# Patient Record
Sex: Female | Born: 1975 | Race: Black or African American | Hispanic: No | Marital: Single | Smoking: Never smoker
Health system: Southern US, Community
[De-identification: ages and names within clinical notes are randomized; demographics above are authoritative.]

## PROBLEM LIST (undated history)

## (undated) DIAGNOSIS — Z9109 Other allergy status, other than to drugs and biological substances: Secondary | ICD-10-CM

## (undated) DIAGNOSIS — N946 Dysmenorrhea, unspecified: Secondary | ICD-10-CM

## (undated) DIAGNOSIS — D649 Anemia, unspecified: Secondary | ICD-10-CM

## (undated) DIAGNOSIS — D219 Benign neoplasm of connective and other soft tissue, unspecified: Secondary | ICD-10-CM

## (undated) DIAGNOSIS — D179 Benign lipomatous neoplasm, unspecified: Secondary | ICD-10-CM

## (undated) DIAGNOSIS — Z86718 Personal history of other venous thrombosis and embolism: Secondary | ICD-10-CM

## (undated) HISTORY — PX: MYOMECTOMY: SHX85

## (undated) HISTORY — PX: WISDOM TOOTH EXTRACTION: SHX21

---

## 1998-03-28 ENCOUNTER — Emergency Department (HOSPITAL_COMMUNITY): Admission: EM | Admit: 1998-03-28 | Discharge: 1998-03-28 | Payer: Self-pay | Admitting: Emergency Medicine

## 1998-12-03 ENCOUNTER — Emergency Department (HOSPITAL_COMMUNITY): Admission: EM | Admit: 1998-12-03 | Discharge: 1998-12-03 | Payer: Self-pay | Admitting: Emergency Medicine

## 1999-12-29 ENCOUNTER — Emergency Department (HOSPITAL_COMMUNITY): Admission: EM | Admit: 1999-12-29 | Discharge: 1999-12-29 | Payer: Self-pay | Admitting: Emergency Medicine

## 1999-12-29 ENCOUNTER — Encounter: Payer: Self-pay | Admitting: Emergency Medicine

## 2000-04-11 ENCOUNTER — Encounter: Payer: Self-pay | Admitting: Emergency Medicine

## 2000-04-11 ENCOUNTER — Emergency Department (HOSPITAL_COMMUNITY): Admission: EM | Admit: 2000-04-11 | Discharge: 2000-04-11 | Payer: Self-pay | Admitting: Internal Medicine

## 2006-11-30 ENCOUNTER — Emergency Department (HOSPITAL_COMMUNITY): Admission: EM | Admit: 2006-11-30 | Discharge: 2006-11-30 | Payer: Self-pay | Admitting: Emergency Medicine

## 2009-10-17 ENCOUNTER — Ambulatory Visit: Payer: Self-pay | Admitting: Family Medicine

## 2009-10-17 DIAGNOSIS — N644 Mastodynia: Secondary | ICD-10-CM

## 2009-10-17 DIAGNOSIS — R030 Elevated blood-pressure reading, without diagnosis of hypertension: Secondary | ICD-10-CM

## 2009-10-25 ENCOUNTER — Encounter: Payer: Self-pay | Admitting: Family Medicine

## 2010-03-06 NOTE — Assessment & Plan Note (Signed)
Summary: NOV: Breast pain, elevated BP   Vital Signs:  Patient profile:   35 year old female Height:      66 inches Weight:      239 pounds BMI:     38.72 Pulse rate:   75 / minute BP sitting:   148 / 91  (right arm) Cuff size:   large  Vitals Entered By: Avon Gully CMA, Duncan Dull) (October 17, 2009 10:27 AM) CC: NP-breast pain x at least two months, feel hot and burning, getting larger,neg preg test in june   CC:  NP-breast pain x at least two months, feel hot and burning, getting larger, and neg preg test in june.  History of Present Illness: NP-breast pain x at least two months, feel hot and burning, getting larger,neg preg test in june.  Wakes her up in the middle of the night sometimes. Sometimes it is bilat.  Had some nausea in June and thought initally pregnant but neg urine test.  Last period was short.  Then had GB US for the nausea but it was normal. Then thought it was hormonal. Worse in the nipple area.  Never had this before. No family hx of breast cancer. Han't felt any lumps. Having changed bra sizes. Periods are regular. No weight gain.  Just started exercises. No supplements. No rashes. No d/c fromt the nipples.  Came off OCPs before June.     Habits & Providers  Alcohol-Tobacco-Diet     Alcohol drinks/day: 0     Tobacco Status: never  Exercise-Depression-Behavior     Does Patient Exercise: no     STD Risk: never     Drug Use: no     Seat Belt Use: always  Current Medications (verified): 1)  Multivitamins  Tabs (Multiple Vitamin) .... One Tablet By Mouth Once A Day  Allergies (verified): No Known Drug Allergies  Comments:  Nurse/Medical Assistant: The patient's medications and allergies were reviewed with the patient and were updated in the Medication and Allergy Lists. 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  Never Smoked Alcohol use-no Drug use-no Regular exercise-no Smoking Status:  never Does Patient Exercise:  no STD Risk:  never Drug Use:  no Seat Belt Use:  always  Review of Systems       No fever/sweats/weakness, unexplained weight loss/gain.  No vison changes.  No difficulty hearing/ringing in ears, hay fever/allergies.  No chest pain/discomfort, palpitations.  No Br lump/nipple discharge.  No cough/wheeze.  No blood in BM, nausea/vomiting/diarrhea.  No nighttime urination, leaking urine, unusual vaginal bleeding, discharge (penis or vagina).  No muscle/joint pain. No rash, change in mole.  No HA, memory loss.  No anxiety, sleep d/o, depression.  No easy bruising/bleeding, unexplained lump   Physical Exam  General:  Well-developed,well-nourished,in no acute distress; alert,appropriate and cooperative throughout examination Chest Wall:  No deformities, masses, or tenderness noted. Breasts:  No mass, nodules, thickening, tenderness, bulging, retraction, inflamation, nipple discharge or skin changes noted.   Lungs:  Normal respiratory effort, chest expands symmetrically. Lungs are clear to auscultation, no crackles or wheezes. Heart:  Normal rate and regular rhythm. S1 and S2 normal without gallop, murmur, click, rub or other extra sounds.   Impression & Recommendations:  Problem # 1:  BREAST PAIN, BILATERAL (ICD-611.71) Gave  reassurance that exam is normla.  REc comfortable bra. Her pain has gradually gotten some better but his is unusual. Will refer for diagnostic mammogram. Likely benign.  Can use OTC pain relievers if would like.   Orders: T-Mammography, Diagnostic (bilateral) (08657)  Problem # 2:  ELEVATED BLOOD PRESSURE WITHOUT DIAGNOSIS OF HYPERTENSION (ICD-796.2) She says her BP is usually normal.  Recheck BP in 2 weeks. AVoidsalt and caffeine.   Complete Medication List: 1)   Multivitamins Tabs (Multiple vitamin) .... One tablet by mouth once a day  Patient Instructions: 1)  REcheck BP with nurse visit in 2 weeks. Avoid salt and caffeine.  2)  We will call you wiht the mammogram referral.   Contraindications/Deferment of Procedures/Staging:    Test/Procedure: FLU VAX    Reason for deferment: patient declined  Appended Document: NOV: Breast pain, elevated BP   Appended Document: NOV: Breast pain, elevated BP Call pt: Normal mammogram. Repeat at age 89.  September 23, 20118:06 AM [place] MD, Santina Evans   10/27/2009 @ 8:43am- Pt notified of results. KJ LPN

## 2012-03-04 ENCOUNTER — Emergency Department (INDEPENDENT_AMBULATORY_CARE_PROVIDER_SITE_OTHER)
Admission: EM | Admit: 2012-03-04 | Discharge: 2012-03-04 | Disposition: A | Discharge status: Home / Self Care | Payer: Managed Care, Other (non HMO) | Source: Home / Self Care | Attending: Family Medicine | Admitting: Family Medicine

## 2012-03-04 ENCOUNTER — Emergency Department (INDEPENDENT_AMBULATORY_CARE_PROVIDER_SITE_OTHER): Payer: Managed Care, Other (non HMO)

## 2012-03-04 ENCOUNTER — Encounter: Payer: Self-pay | Admitting: *Deleted

## 2012-03-04 DIAGNOSIS — J111 Influenza due to unidentified influenza virus with other respiratory manifestations: Secondary | ICD-10-CM

## 2012-03-04 DIAGNOSIS — R509 Fever, unspecified: Secondary | ICD-10-CM

## 2012-03-04 DIAGNOSIS — R0602 Shortness of breath: Secondary | ICD-10-CM

## 2012-03-04 DIAGNOSIS — R05 Cough: Secondary | ICD-10-CM

## 2012-03-04 DIAGNOSIS — J101 Influenza due to other identified influenza virus with other respiratory manifestations: Secondary | ICD-10-CM

## 2012-03-04 DIAGNOSIS — J40 Bronchitis, not specified as acute or chronic: Secondary | ICD-10-CM

## 2012-03-04 LAB — POCT INFLUENZA A/B
Influenza A, POC: POSITIVE
Influenza B, POC: NEGATIVE

## 2012-03-04 MED ORDER — OSELTAMIVIR PHOSPHATE 75 MG PO CAPS: 75.0000 mg | ORAL_CAPSULE | Freq: Two times a day (BID) | ORAL | Status: DC

## 2012-03-04 MED ORDER — AZITHROMYCIN 250 MG PO TABS: ORAL_TABLET | ORAL | Status: DC

## 2012-03-04 MED ORDER — METHYLPREDNISOLONE ACETATE 80 MG/ML IJ SUSP
80.0000 mg | Freq: Once | INTRAMUSCULAR | Status: AC
  Administered 2012-03-04: 80 mg via INTRAMUSCULAR

## 2012-03-04 NOTE — ED Provider Notes (Signed)
History     CSN: [id number]  Arrival date & time 03/04/12  1211   First MD Initiated Contact with Patient 03/04/12 1229      Chief Complaint  Patient presents with  . Fever  . Cough  . Shortness of Breath   HPI  URI Symptoms Onset:  2 days  Description: cough, generalized malaise, fever, chills, cough, SOB  Modifying factors:  No flu shot this season   Symptoms Nasal discharge: yes Fever: yes Sore throat: no Cough: yes Wheezing: yes Ear pain: no GI symptoms: no Sick contacts: yes  Red Flags  Stiff neck: no Dyspnea: yes Rash: no Swallowing difficulty: no  Sinusitis Risk Factors Headache/face pain: no Double sickening: no tooth pain: no  Allergy Risk Factors Sneezing: no Itchy scratchy throat: no Seasonal symptoms: no  Flu Risk Factors Headache: yes muscle aches: yes  severe fatigue: yes    History reviewed. No pertinent past medical history.  History reviewed. No pertinent past surgical history.  Family History  Problem Relation Age of Onset  . Hypertension Mother     History  Substance Use Topics  . Smoking status: Never Smoker   . Smokeless tobacco: Not on file  . Alcohol Use: No    OB History    Grav Para Term Preterm Abortions TAB SAB Ect Mult Living                  Review of Systems  All other systems reviewed and are negative.    Allergies  Review of patient's allergies indicates no known allergies.  Home Medications  No current outpatient prescriptions on file.  BP 126/85  Pulse 116  Temp 99.8 F (37.7 C) (Oral)  Resp 18  Wt 243 lb (110.224 kg)  SpO2 95%  Physical Exam  Constitutional:       Mildly ill appearing, NAD   HENT:  Head: Normocephalic and atraumatic.  Right Ear: External ear normal.  Left Ear: External ear normal.       +nasal erythema, rhinorrhea bilaterally, + post oropharyngeal erythema    Eyes: Conjunctivae normal are normal. Pupils are equal, round, and reactive to light.  Neck: Normal  range of motion. Neck supple.  Cardiovascular: Normal rate, regular rhythm and normal heart sounds.   Pulmonary/Chest: Effort normal.       Faint wheezes in bases    Abdominal: Soft.  Musculoskeletal: Normal range of motion.  Lymphadenopathy:    She has no cervical adenopathy.  Neurological: She is alert.  Skin: Skin is warm.    ED Course  Procedures (including critical care time)  Labs Reviewed - No data to display Dg Chest 2 View  03/04/2012  *RADIOLOGY REPORT*  Clinical Data: Fever, cough, shortness of breath.  CHEST - 2 VIEW  Comparison: None.  Findings: Mild peribronchial thickening. Heart and mediastinal contours are within normal limits.  No focal opacities or effusions.  No acute bony abnormality.  IMPRESSION: Mild bronchitic changes.   Original Report Authenticated By: Charlett Nose, M.D.      1. Influenza A   2. Bronchitis       MDM  Will treat with tamiflu and zpak.  Depomedrol 80mg  IM x1 for wheezing. Continue albuterol inhaler at home prn.  Discussed infectious and resp red flags at length. Follow up with PCP early next week.  Otherwise follow up as needed.    The patient and/or caregiver has been counseled thoroughly with regard to treatment plan and/or medications prescribed including dosage, schedule,  interactions, rationale for use, and possible side effects and they verbalize understanding. Diagnoses and expected course of recovery discussed and will return if not improved as expected or if the condition worsens. Patient and/or caregiver verbalized understanding.              Doree Albee, MD 03/04/12 1329

## 2012-03-04 NOTE — ED Notes (Signed)
Pt c/o fever, congestion, cough and SOB x 1 day. Pt reports that She did not receive a flu vaccine.

## 2014-02-17 IMAGING — CR DG CHEST 2V
2 series · 2 of 2 positions shown · non-contrast
Comparison: None.

CLINICAL DATA: Fever, cough, shortness of breath.

CHEST - 2 VIEW

[view not recorded (1 of 2)]
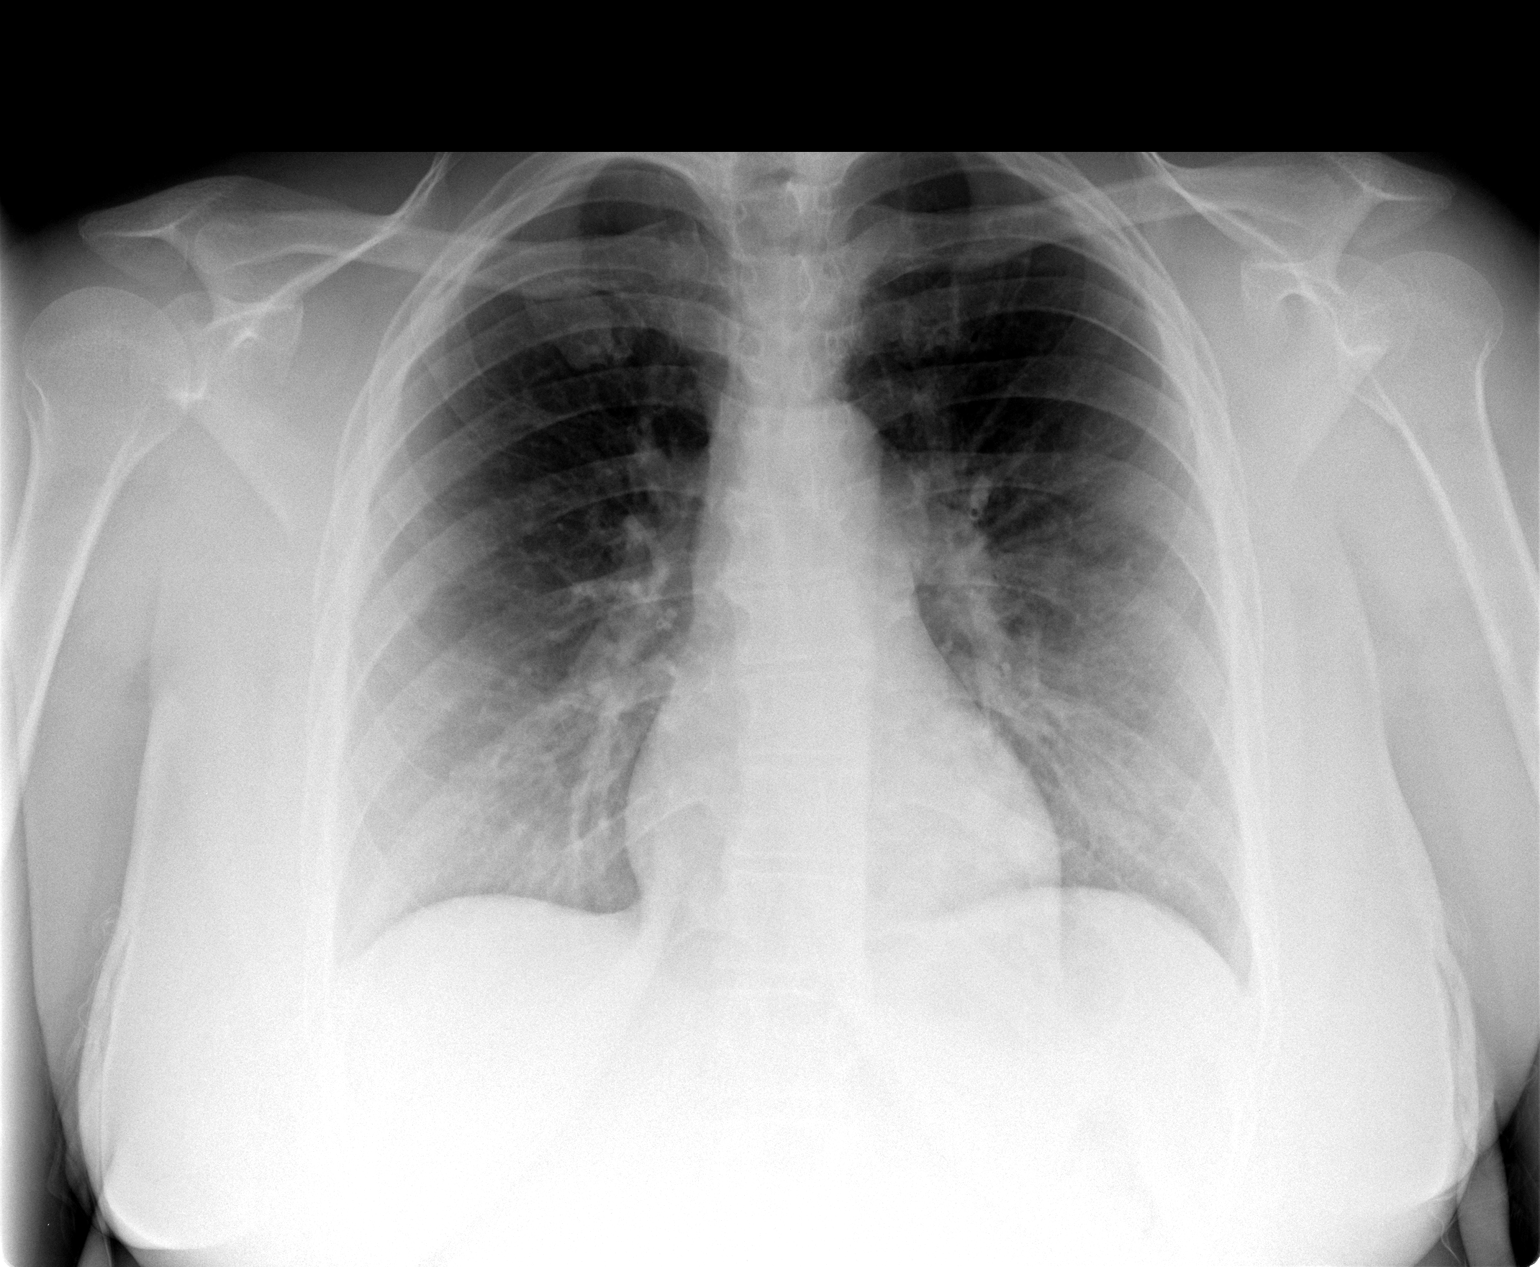

[view not recorded (2 of 2)]
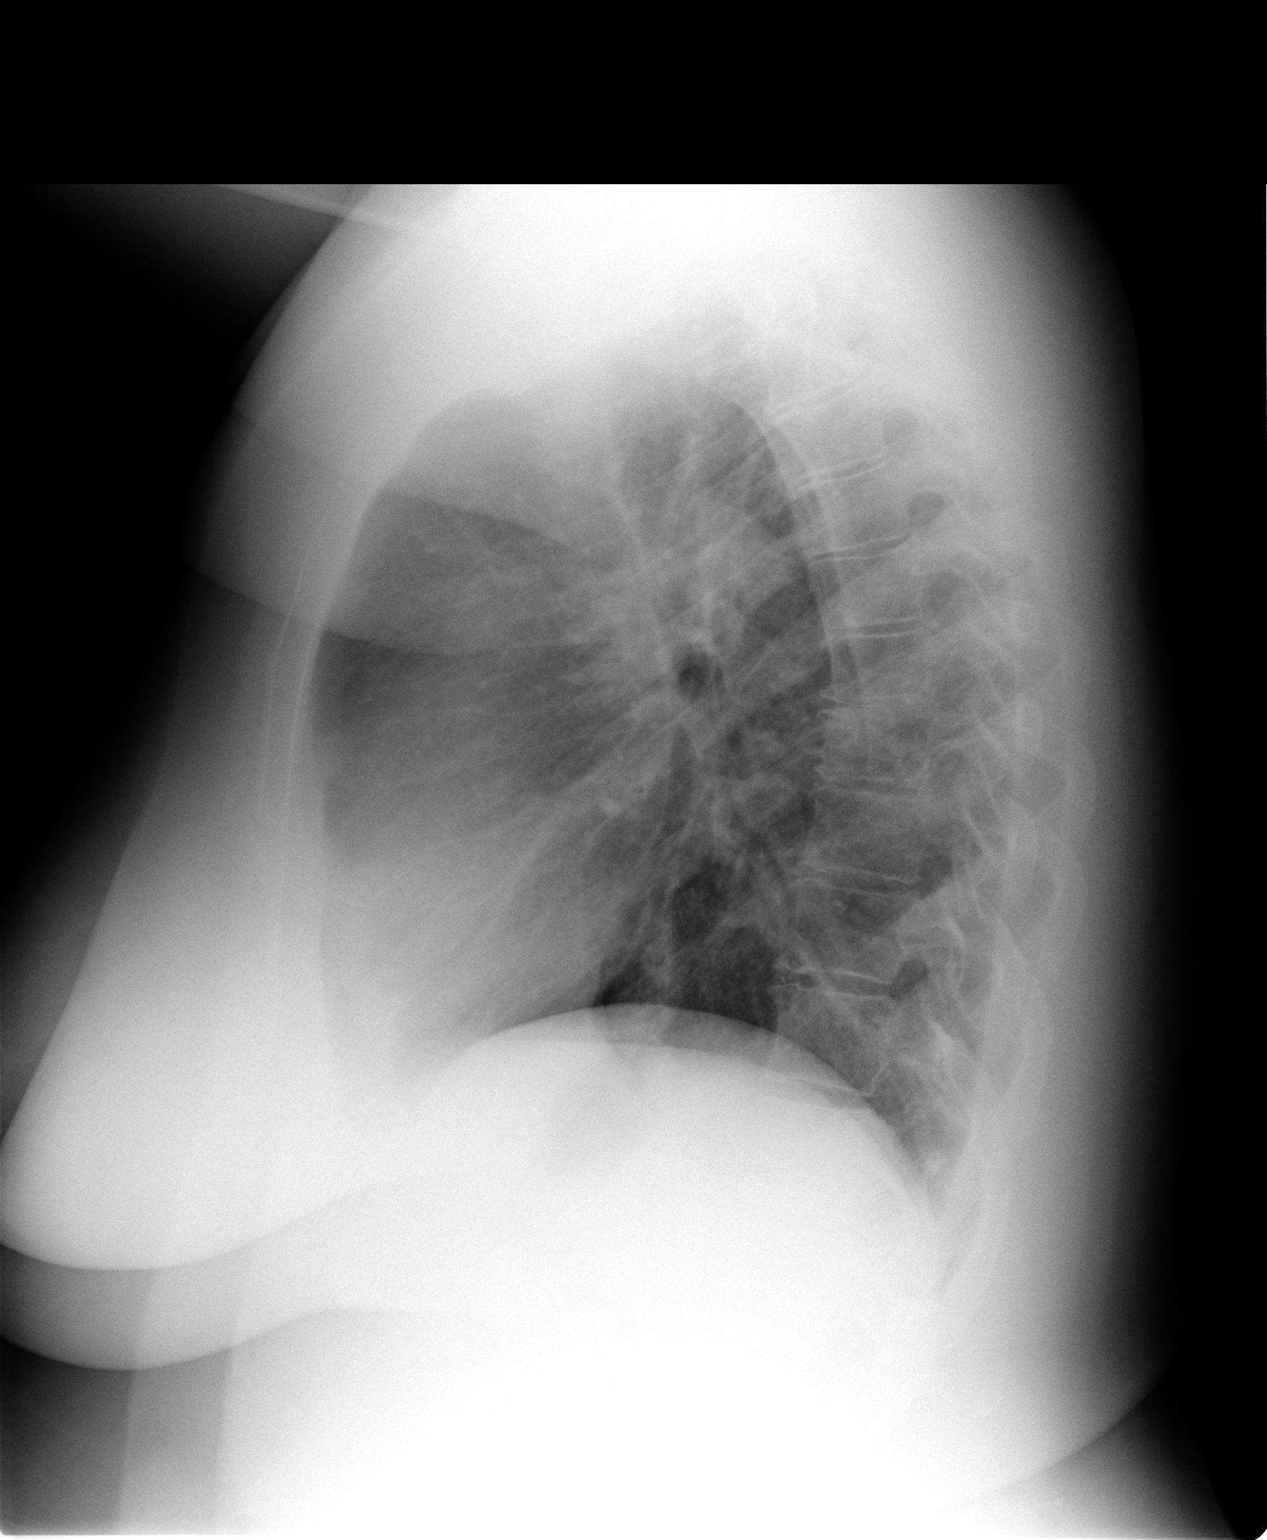

[2 of 2 positions shown; findings below may reference images not displayed]

FINDINGS: Mild peribronchial thickening. Heart and mediastinal
contours are within normal limits.  No focal opacities or
effusions.  No acute bony abnormality.
IMPRESSION: Mild bronchitic changes.

## 2014-05-20 ENCOUNTER — Encounter: Payer: Self-pay | Admitting: Emergency Medicine

## 2014-05-20 ENCOUNTER — Emergency Department (INDEPENDENT_AMBULATORY_CARE_PROVIDER_SITE_OTHER): Payer: Managed Care, Other (non HMO)

## 2014-05-20 ENCOUNTER — Emergency Department
Admission: EM | Admit: 2014-05-20 | Discharge: 2014-05-20 | Disposition: A | Discharge status: Home / Self Care | Payer: Managed Care, Other (non HMO) | Source: Home / Self Care | Attending: Family Medicine | Admitting: Family Medicine

## 2014-05-20 DIAGNOSIS — R0602 Shortness of breath: Secondary | ICD-10-CM

## 2014-05-20 DIAGNOSIS — B9789 Other viral agents as the cause of diseases classified elsewhere: Principal | ICD-10-CM

## 2014-05-20 DIAGNOSIS — J9801 Acute bronchospasm: Secondary | ICD-10-CM | POA: Diagnosis not present

## 2014-05-20 DIAGNOSIS — H6592 Unspecified nonsuppurative otitis media, left ear: Secondary | ICD-10-CM | POA: Diagnosis not present

## 2014-05-20 DIAGNOSIS — J069 Acute upper respiratory infection, unspecified: Secondary | ICD-10-CM | POA: Diagnosis not present

## 2014-05-20 DIAGNOSIS — R05 Cough: Secondary | ICD-10-CM | POA: Diagnosis not present

## 2014-05-20 MED ORDER — ALBUTEROL SULFATE HFA 108 (90 BASE) MCG/ACT IN AERS: 2.0000 | INHALATION_SPRAY | RESPIRATORY_TRACT | Status: DC | PRN

## 2014-05-20 MED ORDER — PREDNISONE 20 MG PO TABS: 20.0000 mg | ORAL_TABLET | Freq: Two times a day (BID) | ORAL | Status: DC

## 2014-05-20 MED ORDER — BENZONATATE 200 MG PO CAPS: 200.0000 mg | ORAL_CAPSULE | Freq: Every day | ORAL | Status: DC

## 2014-05-20 MED ORDER — AMOXICILLIN 875 MG PO TABS: 875.0000 mg | ORAL_TABLET | Freq: Two times a day (BID) | ORAL | Status: DC

## 2014-05-20 NOTE — ED Provider Notes (Signed)
CSN: [id number]     Arrival date & time 05/20/14  1239 History   First MD Initiated Contact with Patient 05/20/14 1325     Chief Complaint  Patient presents with  . Nasal Congestion  . Cough  . Shortness of Breath      HPI Comments: Patient developed sinus congestion with clear discharge three days ago.  The next day she developed myalgias, fatigue, fever to 100.2/sweats, and a productive cough.  She has now developed wheezing and shortness of breath with activity.  She has a history of exercise asthma and has used an albuterol inhaler in the past.  The history is provided by the patient.    History reviewed. No pertinent past medical history. History reviewed. No pertinent past surgical history. Family History  Problem Relation Age of Onset  . Hypertension Mother   . Diabetes Maternal Uncle    History  Substance Use Topics  . Smoking status: Never Smoker   . Smokeless tobacco: Not on file  . Alcohol Use: No   OB History    No data available     Review of Systems No sore throat + cough No pleuritic pain + wheezing + nasal congestion + post-nasal drainage No sinus pain/pressure No itchy/red eyes No earache No hemoptysis + SOB with activity + fever, + chills No nausea No vomiting No abdominal pain No diarrhea No urinary symptoms No skin rash + fatigue + myalgias + headache Used OTC meds without relief  Allergies  Review of patient's allergies indicates no known allergies.  Home Medications   Prior to Admission medications   Medication Sig Start Date End Date Taking? Authorizing Provider  albuterol (PROVENTIL HFA;VENTOLIN HFA) 108 (90 BASE) MCG/ACT inhaler Inhale 2 puffs into the lungs every 4 (four) hours as needed for wheezing or shortness of breath. 05/20/14   Kandra Nicolas, MD  amoxicillin (AMOXIL) 875 MG tablet Take 1 tablet (875 mg total) by mouth 2 (two) times daily. 05/20/14   Kandra Nicolas, MD  benzonatate (TESSALON) 200 MG capsule Take 1 capsule  (200 mg total) by mouth at bedtime. Take as needed for cough 05/20/14   Kandra Nicolas, MD  predniSONE (DELTASONE) 20 MG tablet Take 1 tablet (20 mg total) by mouth 2 (two) times daily. Take with food. 05/20/14   Kandra Nicolas, MD   BP 166/80 mmHg  Pulse 93  Temp(Src) 98.5 F (36.9 C) (Oral)  Resp 18  Ht 5\' 6"  (1.676 m)  Wt 240 lb (108.863 kg)  BMI 38.76 kg/m2  SpO2 95%  LMP 04/22/2014 (Exact Date) Physical Exam Nursing notes and Vital Signs reviewed. Appearance:  Patient appears stated age, and in no acute distress.  Patient is obese (BMI 38.8) Eyes:  Pupils are equal, round, and reactive to light and accomodation.  Extraocular movement is intact.  Conjunctivae are not inflamed  Ears:  Canals normal.  Right tympanic membrane normal.  Left tympanic membrane has serous effusion. Nose:  Congested turbinates.  No sinus tenderness.  Pharynx:  Normal Neck:  Supple.  Slightly tender posterior nodes are palpated bilaterally  Lungs:   Bilateral faint expiratory wheezes prsent.  Breath sounds are equal.  Heart:  Regular rate and rhythm without murmurs, rubs, or gallops.  Abdomen:  Nontender without masses or hepatosplenomegaly.  Bowel sounds are present.  No CVA or flank tenderness.  Extremities:  No edema.  No calf tenderness Skin:  No rash present.   ED Course  Procedures none   Labs  Reviewed -   Tympanogram:  Left ear negative peak pressure; right ear normal   Imaging Review Dg Chest 2 View  05/20/2014   CLINICAL DATA:  Sob, cough, congestion x3days. Nonsmoker.  EXAM: CHEST  2 VIEW  COMPARISON:  03/04/2012  FINDINGS: Cardiac silhouette normal in size and configuration. No mediastinal or hilar masses or evidence of adenopathy. Clear lungs. No pleural effusion or pneumothorax.  Bony thorax is intact.  IMPRESSION: No active cardiopulmonary disease.   Electronically Signed   By: Lajean Manes M.D.   On: 05/20/2014 13:36     MDM   1. Viral URI with cough   2. Bronchospasm, acute   3.  Left serous otitis media, recurrence not specified, unspecified chronicity    Begin prednisone burst, amoxicillin 875mg  BID.  Prescription written for Benzonatate [facility]) to take at bedtime for night-time cough.  Begin albuterol inhaler prn. Take plain guaifenesin (1200mg  extended release tabs such as Mucinex) twice daily, with plenty of water, for cough and congestion.  May add Pseudoephedrine (30mg , one or two every 4 to 6 hours) for sinus congestion.  Get adequate rest.   May use Afrin nasal spray (or generic oxymetazoline) twice daily for about 5 days.  Also recommend using saline nasal spray several times daily and saline nasal irrigation (AYR is a common brand).   Try warm salt water gargles for sore throat.  Stop all antihistamines for now, and other non-prescription cough/cold preparations. May take Ibuprofen 200mg , 4 tabs every 8 hours with food for headache, etc.   Follow-up with family doctor if not improving about10 days.     Kandra Nicolas, MD 05/23/14 1159

## 2014-05-20 NOTE — Discharge Instructions (Signed)
Take plain guaifenesin (1200mg  extended release tabs such as Mucinex) twice daily, with plenty of water, for cough and congestion.  May add Pseudoephedrine (30mg , one or two every 4 to 6 hours) for sinus congestion.  Get adequate rest.   May use Afrin nasal spray (or generic oxymetazoline) twice daily for about 5 days.  Also recommend using saline nasal spray several times daily and saline nasal irrigation (AYR is a common brand).   Try warm salt water gargles for sore throat.  Stop all antihistamines for now, and other non-prescription cough/cold preparations. May take Ibuprofen 200mg , 4 tabs every 8 hours with food for headache, etc.   Follow-up with family doctor if not improving about10 days.

## 2014-05-20 NOTE — ED Notes (Signed)
Reports 3 day history of congestion, cough, fever, some shortness of breath and body aches. She has been taking tylenol, sudafed, and mucinex.

## 2014-08-14 ENCOUNTER — Encounter: Payer: Self-pay | Admitting: Emergency Medicine

## 2014-08-14 ENCOUNTER — Emergency Department
Admission: EM | Admit: 2014-08-14 | Discharge: 2014-08-14 | Disposition: A | Discharge status: Home / Self Care | Payer: Managed Care, Other (non HMO) | Source: Home / Self Care | Attending: Family Medicine | Admitting: Family Medicine

## 2014-08-14 DIAGNOSIS — R350 Frequency of micturition: Secondary | ICD-10-CM | POA: Diagnosis not present

## 2014-08-14 DIAGNOSIS — M545 Low back pain, unspecified: Secondary | ICD-10-CM

## 2014-08-14 LAB — POCT URINALYSIS DIP (MANUAL ENTRY)
Bilirubin, UA: NEGATIVE
Glucose, UA: NEGATIVE
Ketones, POC UA: NEGATIVE
Leukocytes, UA: NEGATIVE
Nitrite, UA: NEGATIVE
Protein Ur, POC: NEGATIVE
Spec Grav, UA: 1.015 (ref 1.005–1.03)
Urobilinogen, UA: 1 (ref 0–1)
pH, UA: 7 (ref 5–8)

## 2014-08-14 MED ORDER — PHENAZOPYRIDINE HCL 200 MG PO TABS: 200.0000 mg | ORAL_TABLET | Freq: Three times a day (TID) | ORAL | Status: DC

## 2014-08-14 MED ORDER — NITROFURANTOIN MONOHYD MACRO 100 MG PO CAPS: 100.0000 mg | ORAL_CAPSULE | Freq: Two times a day (BID) | ORAL | Status: DC

## 2014-08-14 NOTE — Discharge Instructions (Signed)
Today in urine only showed a small trace of blood flow concern for bacterial urinary infection.  However, your urine has been sent for urine culture.  You will be called within the next 2-3 days with the results to advise whether you should complete her antibiotic or not.  Regardless taking antibiotic, you should follow-up with primary care and/or urology within 1 week if symptoms not improving.  See below for further instructions.

## 2014-08-14 NOTE — ED Provider Notes (Signed)
CSN: [id number]     Arrival date & time 08/14/14  1236 History   First MD Initiated Contact with Patient 08/14/14 1254     Chief Complaint  Patient presents with  . Urinary Tract Infection   (Consider location/radiation/quality/duration/timing/severity/associated sxs/prior Treatment) HPI  Patient is a 39 year old female presenting to urgent care with complaint of 2 day history urinary frequency and left lower back pain that seems to be worsening.  Pain is achy and sore.  Mild to moderate in severity.  Patient also reports some discomfort with urination.  She has been taking Azo without relief.  Denies fevers, chills, nausea, vomiting or diarrhea.  Patient does report history of recurrent UTIs.  States this feels similar to prior UTIs.  States she has seen a urologist who cannot figure out why she keeps getting UTIs.  Denies known triggers.  Denies history of kidney stones.  Denies abdominal pain at this time.  Denies vaginal symptoms.  History reviewed. No pertinent past medical history. History reviewed. No pertinent past surgical history. Family History  Problem Relation Age of Onset  . Hypertension Mother   . Diabetes Maternal Uncle    History  Substance Use Topics  . Smoking status: Never Smoker   . Smokeless tobacco: Not on file  . Alcohol Use: No   OB History    No data available     Review of Systems  Constitutional: Negative for fever and chills.  Respiratory: Negative for cough and shortness of breath.   Cardiovascular: Negative for chest pain and palpitations.  Gastrointestinal: Negative for nausea, vomiting, abdominal pain and diarrhea.  Genitourinary: Positive for frequency. Negative for dysuria, urgency, hematuria, flank pain and pelvic pain.  Musculoskeletal: Positive for back pain ( Left lower). Negative for myalgias.  All other systems reviewed and are negative.   Allergies  Review of patient's allergies indicates no known allergies.  Home Medications    Prior to Admission medications   Medication Sig Start Date End Date Taking? Authorizing Provider  albuterol (PROVENTIL HFA;VENTOLIN HFA) 108 (90 BASE) MCG/ACT inhaler Inhale 2 puffs into the lungs every 4 (four) hours as needed for wheezing or shortness of breath. 05/20/14   Kandra Nicolas, MD  amoxicillin (AMOXIL) 875 MG tablet Take 1 tablet (875 mg total) by mouth 2 (two) times daily. 05/20/14   Kandra Nicolas, MD  benzonatate (TESSALON) 200 MG capsule Take 1 capsule (200 mg total) by mouth at bedtime. Take as needed for cough 05/20/14   Kandra Nicolas, MD  nitrofurantoin, macrocrystal-monohydrate, (MACROBID) 100 MG capsule Take 1 capsule (100 mg total) by mouth 2 (two) times daily. For 5 days 08/14/14   Noland Fordyce, PA-C  phenazopyridine (PYRIDIUM) 200 MG tablet Take 1 tablet (200 mg total) by mouth 3 (three) times daily. 08/14/14   Noland Fordyce, PA-C  predniSONE (DELTASONE) 20 MG tablet Take 1 tablet (20 mg total) by mouth 2 (two) times daily. Take with food. 05/20/14   Kandra Nicolas, MD   BP 144/89 mmHg  Pulse 85  Temp(Src) 99 F (37.2 C) (Oral)  Resp 16  Ht 5\' 6"  (1.676 m)  Wt 245 lb (111.131 kg)  BMI 39.56 kg/m2  SpO2 99%  LMP 08/01/2014 Physical Exam  Constitutional: She appears well-developed and well-nourished. No distress.  HENT:  Head: Normocephalic and atraumatic.  Eyes: Conjunctivae are normal. No scleral icterus.  Neck: Normal range of motion. Neck supple.  Cardiovascular: Normal rate, regular rhythm and normal heart sounds.   Pulmonary/Chest: Effort normal  and breath sounds normal. No respiratory distress. She has no wheezes. She has no rales. She exhibits no tenderness.  Abdominal: Soft. Bowel sounds are normal. She exhibits no distension and no mass. There is no tenderness. There is no rebound and no guarding.  Soft, non-distended, non-tender. No CVAT  Musculoskeletal: Normal range of motion. She exhibits tenderness.  Mild tenderness to Left lumbar muscles. No  midline spinal tenderness. FROM upper and lower extremities with 5/5 strength bilaterally.   Neurological: She is alert.  Skin: Skin is warm and dry. She is not diaphoretic.  Nursing note and vitals reviewed.   ED Course  Procedures (including critical care time) Labs Review Labs Reviewed  POCT URINALYSIS DIP (MANUAL ENTRY) - Abnormal; Notable for the following:    Blood, UA small (*)    All other components within normal limits  URINE CULTURE    Imaging Review No results found.   MDM   1. Urinary frequency   2. Left-sided low back pain without sciatica     Patient is a 39 year old female with history of recurrent UTIs presenting to urgent care with urinary frequency and left lower back pain.  Patient appears well, nontoxic temp of 99 on exam.  No CVA tenderness, but there is left lower lumbar muscle tenderness with palpation.  No bony tenderness.  No focal neuro deficits.  Patient denies vaginal symptoms.  Abdomen is soft, nontender.  Doubt surgical abdomen.  UA is significant for small amount of blood, otherwise no evidence of UTI.  Will send urine for culture.  Due to symptoms being consistent with prior UTIs as well as blood in urine flow prescribed.  Patient Macrobid, however.  Advised not to fill for 2-3 days until culture results, as she will be notified of results.  Patient was prescribed Pyridium which she may fill immediately to help with symptoms. Return precautions provided. Pt verbalized understanding and agreement with tx plan.     Noland Fordyce, PA-C 08/14/14 1346

## 2014-08-14 NOTE — ED Notes (Signed)
Patient presents to the [facility] with C/O urinary frequency and lower back pain since Friday. Taking AZO without relief.

## 2014-08-15 ENCOUNTER — Telehealth: Payer: Self-pay | Admitting: *Deleted

## 2014-08-15 LAB — URINE CULTURE: Colony Count: 35000

## 2014-12-05 ENCOUNTER — Encounter: Payer: Self-pay | Admitting: Osteopathic Medicine

## 2014-12-05 ENCOUNTER — Ambulatory Visit (INDEPENDENT_AMBULATORY_CARE_PROVIDER_SITE_OTHER): Payer: Managed Care, Other (non HMO) | Admitting: Osteopathic Medicine

## 2014-12-05 VITALS — BP 135/85 | HR 79 | Ht 66.0 in | Wt 253.0 lb

## 2014-12-05 DIAGNOSIS — E669 Obesity, unspecified: Secondary | ICD-10-CM

## 2014-12-05 DIAGNOSIS — R002 Palpitations: Secondary | ICD-10-CM | POA: Diagnosis not present

## 2014-12-05 DIAGNOSIS — Z13220 Encounter for screening for lipoid disorders: Secondary | ICD-10-CM | POA: Diagnosis not present

## 2014-12-05 LAB — CBC
HEMATOCRIT: 35.7 % — AB (ref 36.0–46.0)
Hemoglobin: 11.6 g/dL — ABNORMAL LOW (ref 12.0–15.0)
MCH: 25.8 pg — ABNORMAL LOW (ref 26.0–34.0)
MCHC: 32.5 g/dL (ref 30.0–36.0)
MCV: 79.5 fL (ref 78.0–100.0)
MPV: 10.5 fL (ref 8.6–12.4)
PLATELETS: 291 10*3/uL (ref 150–400)
RBC: 4.49 MIL/uL (ref 3.87–5.11)
RDW: 14 % (ref 11.5–15.5)
WBC: 6.3 10*3/uL (ref 4.0–10.5)

## 2014-12-05 LAB — LIPID PANEL
CHOL/HDL RATIO: 3 ratio (ref <=5.0)
Cholesterol: 161 mg/dL (ref 125–200)
HDL: 53 mg/dL (ref >=46)
LDL Cholesterol: 96 mg/dL (ref <130)
Triglycerides: 61 mg/dL (ref <150)
VLDL: 12 mg/dL (ref <30)

## 2014-12-05 NOTE — Patient Instructions (Signed)
Palpitations A palpitation is the feeling that your heartbeat is irregular or is faster than normal. It may feel like your heart is fluttering or skipping a beat. Palpitations are usually not a serious problem. However, in some cases, you may need further medical evaluation. CAUSES  Palpitations can be caused by:  Smoking.  Caffeine or other stimulants, such as diet pills or energy drinks.  Alcohol.  Stress and anxiety.  Strenuous physical activity.  Fatigue.  Certain medicines.  Heart disease, especially if you have a history of irregular heart rhythms (arrhythmias), such as atrial fibrillation, atrial flutter, or supraventricular tachycardia.  An improperly working pacemaker or defibrillator. DIAGNOSIS  To find the cause of your palpitations, your health care provider will take your medical history and perform a physical exam. Your health care provider may also have you take a test called an ambulatory electrocardiogram (ECG). A TREATMENT  Treatment of palpitations depends on the cause of your symptoms and can vary greatly. Most cases of palpitations do not require any treatment other than time, relaxation, and monitoring your symptoms. Other causes, such as atrial fibrillation, atrial flutter, or supraventricular tachycardia, usually require further treatment. HOME CARE INSTRUCTIONS   Avoid:  Caffeinated coffee, tea, soft drinks, diet pills, and energy drinks.  Chocolate.  Alcohol.  Stop smoking if you smoke.  Reduce your stress and anxiety. Things that can help you relax include:  A method of controlling things in your body, such as your heartbeats, with your mind (biofeedback).  Yoga.  Meditation.  Physical activity such as swimming, jogging, or walking.  Get plenty of rest and sleep. SEEK MEDICAL CARE IF:   You continue to have a fast or irregular heartbeat beyond 24 hours.  Your palpitations occur more often. SEEK IMMEDIATE MEDICAL CARE IF:  You have  chest pain or shortness of breath.  You have a severe headache.  You feel dizzy or you faint. MAKE SURE YOU:  Understand these instructions.  Will watch your condition.  Will get help right away if you are not doing well or get worse.   This information is not intended to replace advice given to you by your health care provider. Make sure you discuss any questions you have with your health care provider.   Document Released: 01/19/2000 Document Revised: 01/26/2013 Document Reviewed: 03/22/2011 Elsevier Interactive Patient Education [employer].

## 2014-12-05 NOTE — Progress Notes (Signed)
HPI: Jennifer Shah is a 39 y.o. female who presents to [place]  today for chief complaint of:  Chief Complaint  Patient presents with  . Establish Care    pcp   Palpitations and bounding pulse . Location: chest and feet . Severity: moderate . Duration: few minutes . Timing: worse at night, can feel "bounding pulse" at random  . Context: (+) stress . Modifying factors: Has been on Toprol XL 25 mg bid in the past which helped "heart flutters" before.  . Assoc signs/symptoms: No chest pain or SOB on exertion. No LOC/dizziness.    Past medical, social and family history reviewed: History reviewed. No pertinent past medical history. History reviewed. No pertinent past surgical history. Social History  Substance Use Topics  . Smoking status: Never Smoker   . Smokeless tobacco: Not on file  . Alcohol Use: No   Family History  Problem Relation Age of Onset  . Hypertension Mother   . Glaucoma Father     Current Outpatient Prescriptions  Medication Sig Dispense Refill  . cetirizine (ZYRTEC) 10 MG tablet Take 10 mg by mouth daily.    Marland Kitchen albuterol (PROVENTIL HFA;VENTOLIN HFA) 108 (90 BASE) MCG/ACT inhaler Inhale 2 puffs into the lungs every 4 (four) hours as needed for wheezing or shortness of breath. (Patient not taking: Reported on 12/05/2014) 1 Inhaler 0   No current facility-administered medications for this visit.   No Known Allergies    Review of Systems: CONSTITUTIONAL:  No  fever, no chills, No  unintentional weight changes HEAD/EYES/EARS/NOSE/THROAT: No headache, no vision change, no hearing change, No  sore throat CARDIAC: No chest pain, (+) palpitations, no pressure, no orthopnea RESPIRATORY: No  cough, No  shortness of breath/wheeze GASTROINTESTINAL: No nausea, no vomiting, no abdominal pain, no blood in stool, no diarrhea, no constipation MUSCULOSKELETAL: No  myalgia/arthralgia GENITOURINARY: No incontinence, No abnormal genital  bleeding/discharge SKIN: No rash/wounds/concerning lesions HEM/ONC: No easy bruising/bleeding, no abnormal lymph node ENDOCRINE: No polyuria/polydipsia/polyphagia, no heat/cold intolerance  NEUROLOGIC: No weakness, no dizziness, no slurred speech PSYCHIATRIC: No concerns with depression, no concerns with anxiety, no sleep problems, (+) stress   Exam:  BP 135/85 mmHg  Pulse 79  Ht 5\' 6"  (1.676 m)  Wt 253 lb (114.76 kg)  BMI 40.85 kg/m2  SpO2 98% Constitutional: VSS, see above. General Appearance: alert, well-developed, well-nourished, NAD Eyes: Normal lids and conjunctive, non-icteric sclera, PERRLA Ears, Nose, Mouth, Throat: Normal external inspection ears/nares/mouth/lips/gums,  MMM, Neck: No masses, trachea midline. No thyroid enlargement/tenderness/mass appreciated. No lymphadenopathy Respiratory: Normal respiratory effort. no wheeze, no rhonchi, no rales Cardiovascular: S1/S2 normal, no murmur, no rub/gallop auscultated. RRR.  No carotid bruit or JVD. No abdominal aortic bruit.  Pedal pulse II/IV bilaterally DP and PT.  No lower extremity edema. Musculoskeletal: Gait normal. No clubbing/cyanosis of digits.  Neurological: No cranial nerve deficit on limited exam. Motor and sensation intact and symmetric Psychiatric: Normal judgment/insight. Normal mood and affect. Oriented x3.    No results found for this or any previous visit (from the past 72 hour(s)).  Old VS reviewed:  Vitals with BMI 12/05/2014 08/14/2014 05/20/2014 03/04/2012 10/17/2009  Height 5\' 6"  5\' 6"  5\' 6"   5\' 6"   Weight 253 lbs 245 lbs 240 lbs 243 lbs 239 lbs  BMI 40.9 39.6 41.9  37.9  Systolic 024 097 353 299 242  Diastolic 85 89 80 85 91  Pulse 79 85 93 116 75  Respirations  16 18 18  EKG interpretation: Rate: 74  Rhythm: Sinus No ST/T changes concerning for acute ischemia/infarct    ASSESSMENT/PLAN:  Palpitations - Plan: Calcium, ionized, CBC, COMPLETE METABOLIC PANEL WITH GFR, Magnesium, TSH, EKG  12-Lead  Lipid screening - Plan: Lipid panel   ER precautions reviewed. Palpitations likely stress related, will check labs and treat accordingly if any concerning positives. Otherwise RTC as below or sooner if any concerns or worsening symptoms.   Return in about 4 weeks (around 01/02/2015), or if symptoms worsen or fail to improve, for ANNUAL PHYSICAL, RECHECK PALPITATION.

## 2014-12-06 LAB — COMPLETE METABOLIC PANEL WITH GFR
ALT: 13 U/L (ref 6–29)
AST: 18 U/L (ref 10–30)
Albumin: 4.1 g/dL (ref 3.6–5.1)
Alkaline Phosphatase: 58 U/L (ref 33–115)
BUN: 9 mg/dL (ref 7–25)
CHLORIDE: 103 mmol/L (ref 98–110)
CO2: 27 mmol/L (ref 20–31)
CREATININE: 0.7 mg/dL (ref 0.50–1.10)
Calcium: 9.5 mg/dL (ref 8.6–10.2)
GFR, Est African American: >89 mL/min (ref >=60)
GFR, Est Non African American: >89 mL/min (ref >=60)
Glucose, Bld: 104 mg/dL — ABNORMAL HIGH (ref 65–99)
Potassium: 3.8 mmol/L (ref 3.5–5.3)
Sodium: 140 mmol/L (ref 135–146)
Total Bilirubin: 0.8 mg/dL (ref 0.2–1.2)
Total Protein: 7.4 g/dL (ref 6.1–8.1)

## 2014-12-06 LAB — CALCIUM, IONIZED: Calcium, Ion: 1.31 mmol/L (ref 1.12–1.32)

## 2014-12-06 LAB — TSH: TSH: 2.04 u[IU]/mL (ref 0.350–4.500)

## 2014-12-06 LAB — MAGNESIUM: Magnesium: 1.6 mg/dL (ref 1.5–2.5)

## 2015-01-02 ENCOUNTER — Ambulatory Visit (INDEPENDENT_AMBULATORY_CARE_PROVIDER_SITE_OTHER): Payer: Managed Care, Other (non HMO) | Admitting: Osteopathic Medicine

## 2015-01-02 DIAGNOSIS — R002 Palpitations: Secondary | ICD-10-CM

## 2015-01-02 NOTE — Progress Notes (Signed)
No show for 8:30 appointment

## 2015-02-20 ENCOUNTER — Emergency Department
Admission: EM | Admit: 2015-02-20 | Discharge: 2015-02-20 | Disposition: A | Discharge status: Home / Self Care | Payer: Managed Care, Other (non HMO) | Source: Home / Self Care | Attending: Emergency Medicine | Admitting: Emergency Medicine

## 2015-02-20 ENCOUNTER — Encounter: Payer: Self-pay | Admitting: Emergency Medicine

## 2015-02-20 ENCOUNTER — Emergency Department (INDEPENDENT_AMBULATORY_CARE_PROVIDER_SITE_OTHER): Payer: Managed Care, Other (non HMO)

## 2015-02-20 DIAGNOSIS — S93602A Unspecified sprain of left foot, initial encounter: Secondary | ICD-10-CM

## 2015-02-20 DIAGNOSIS — M25572 Pain in left ankle and joints of left foot: Secondary | ICD-10-CM

## 2015-02-20 MED ORDER — MELOXICAM 7.5 MG PO TABS: ORAL_TABLET | ORAL | Status: DC

## 2015-02-20 NOTE — ED Provider Notes (Addendum)
CSN: [id number]     Arrival date & time 02/20/15  1844 History   None    Chief Complaint  Patient presents with  . Foot Pain  . Foot Swelling   (Consider location/radiation/quality/duration/timing/severity/associated sxs/prior Treatment) HPI Patient complains of intermittent worsening sharp and dull pain left dorsum mid foot for 6 days. Intensity of pain: 3 out of 10 at rest, 7 out of 10 with weightbearing and activity. Worse when she does exercise in dance. Has done some repetitive motions but recalls no specific injury. Tried ice and ibuprofen without significant improvement. Associated symptoms: Swelling of left foot. Left calf feels minimally swollen. Denies ankle pain or other joint pain or swelling. No nausea or vomiting fever or chills. No chest pain or shortness of breath. Denies history of blood clots. Denies chance of pregnancy. LMP started yesterday. History reviewed. No pertinent past medical history. History reviewed. No pertinent past surgical history. Family History  Problem Relation Age of Onset  . Hypertension Mother   . Glaucoma Father    Social History  Substance Use Topics  . Smoking status: Never Smoker   . Smokeless tobacco: None  . Alcohol Use: No   OB History    No data available     Review of Systems  All other systems reviewed and are negative.   Allergies  Review of patient's allergies indicates no known allergies.  Home Medications   Prior to Admission medications   Medication Sig Start Date End Date Taking? Authorizing Provider  albuterol (PROVENTIL HFA;VENTOLIN HFA) 108 (90 BASE) MCG/ACT inhaler Inhale 2 puffs into the lungs every 4 (four) hours as needed for wheezing or shortness of breath. Patient not taking: Reported on 12/05/2014 05/20/14   Kandra Nicolas, MD  cetirizine (ZYRTEC) 10 MG tablet Take 10 mg by mouth daily.    Historical Provider, MD  meloxicam (MOBIC) 7.5 MG tablet Take 1 twice a day as needed for pain. Take with food. (Do  not take with any other NSAID.) 02/20/15   Jacqulyn Cane, MD  meloxicam (MOBIC) 7.5 MG tablet Take 1 twice a day as needed for pain. Take with food. (Do not take with any other NSAID.) 02/20/15   Jacqulyn Cane, MD   Meds Ordered and Administered this Visit  Medications - No data to display  BP 151/90 mmHg  Pulse 74  Temp(Src) 98.2 F (36.8 C) (Oral)  Resp 16  Ht 5\' 5"  (1.651 m)  Wt 259 lb (117.482 kg)  BMI 43.10 kg/m2  SpO2 100%  LMP 02/19/2015 No data found.   Physical Exam  Constitutional: She is oriented to person, place, and time. She appears well-developed and well-nourished. No distress.  HENT:  Head: Normocephalic and atraumatic.  Eyes: Conjunctivae and EOM are normal. Pupils are equal, round, and reactive to light. No scleral icterus.  Neck: Normal range of motion.  Cardiovascular: Normal rate.   Pulmonary/Chest: Effort normal.  Abdominal: She exhibits no distension.  Musculoskeletal:       Left ankle: Normal. She exhibits no swelling. No tenderness.       Left lower leg: She exhibits no tenderness, no bony tenderness, no swelling and no edema.       Left foot: There is decreased range of motion, tenderness, bony tenderness (dorsum of the midfoot) and swelling. There is normal capillary refill, no deformity and no laceration.  Neurological: She is alert and oriented to person, place, and time.  Skin: Skin is warm.  Psychiatric: She has a normal mood and  affect.  Nursing note and vitals reviewed.   ED Course  Procedures (including critical care time)  Labs Review Labs Reviewed - No data to display  Imaging Review Dg Foot Complete Left  02/20/2015  CLINICAL DATA:  40 year old female with swelling overlying the top of left foot for the past 6 days. No known injury. EXAM: LEFT FOOT - COMPLETE 3+ VIEW COMPARISON:  Prior radiographs of the left ankle 11/30/2006 FINDINGS: There is no evidence of fracture or dislocation. There is no evidence of arthropathy or other focal  bone abnormality. Focal soft tissue swelling over the dorsal forefoot. No retained radiopaque foreign body. IMPRESSION: 1. Mild focal soft tissue swelling over the dorsal forefoot. 2. No evidence of underlying osseous abnormality or retained radiopaque foreign body. Electronically Signed   By: Jacqulynn Cadet M.D.   On: 02/20/2015 19:28      MDM   1. Sprain of left foot, initial encounter    Reviewed with patient that x-ray left foot shows no fracture or dislocation. Treatment options discussed, as well as risks, benefits, alternatives. Patient voiced understanding and agreement with the following plans: Encourage rest, ice, compression with ACE bandage, and elevation of injured body part. Postop type shoe applied left foot.-This helped to decrease pain. Prescribed meloxicam 7.5 mg twice a day with food when necessary pain. Follow-up with your primary care doctor or sports medicine specialist or orthopedist in 5-7 days if not improving, or sooner if symptoms become worse. Precautions discussed. Red flags discussed. Questions invited and answered. Patient voiced understanding and agreement.     Jacqulyn Cane, MD 02/20/15 2100  Jacqulyn Cane, MD 02/20/15 2100

## 2015-02-20 NOTE — ED Notes (Signed)
Patient reports intermittent swelling and pain of left foot for about 6 days; no known injury, but does exercise and dance. Has used ice and ibuprofen.

## 2017-02-04 IMAGING — CR DG FOOT COMPLETE 3+V*L*
3 series · 3 of 3 positions shown · non-contrast
Comparison: Prior radiographs of the left ankle 11/30/2006

CLINICAL DATA: 39-year-old female with swelling overlying the top
of left foot for the past 6 days. No known injury.

EXAM:
LEFT FOOT - COMPLETE 3+ VIEW

[foot ap]
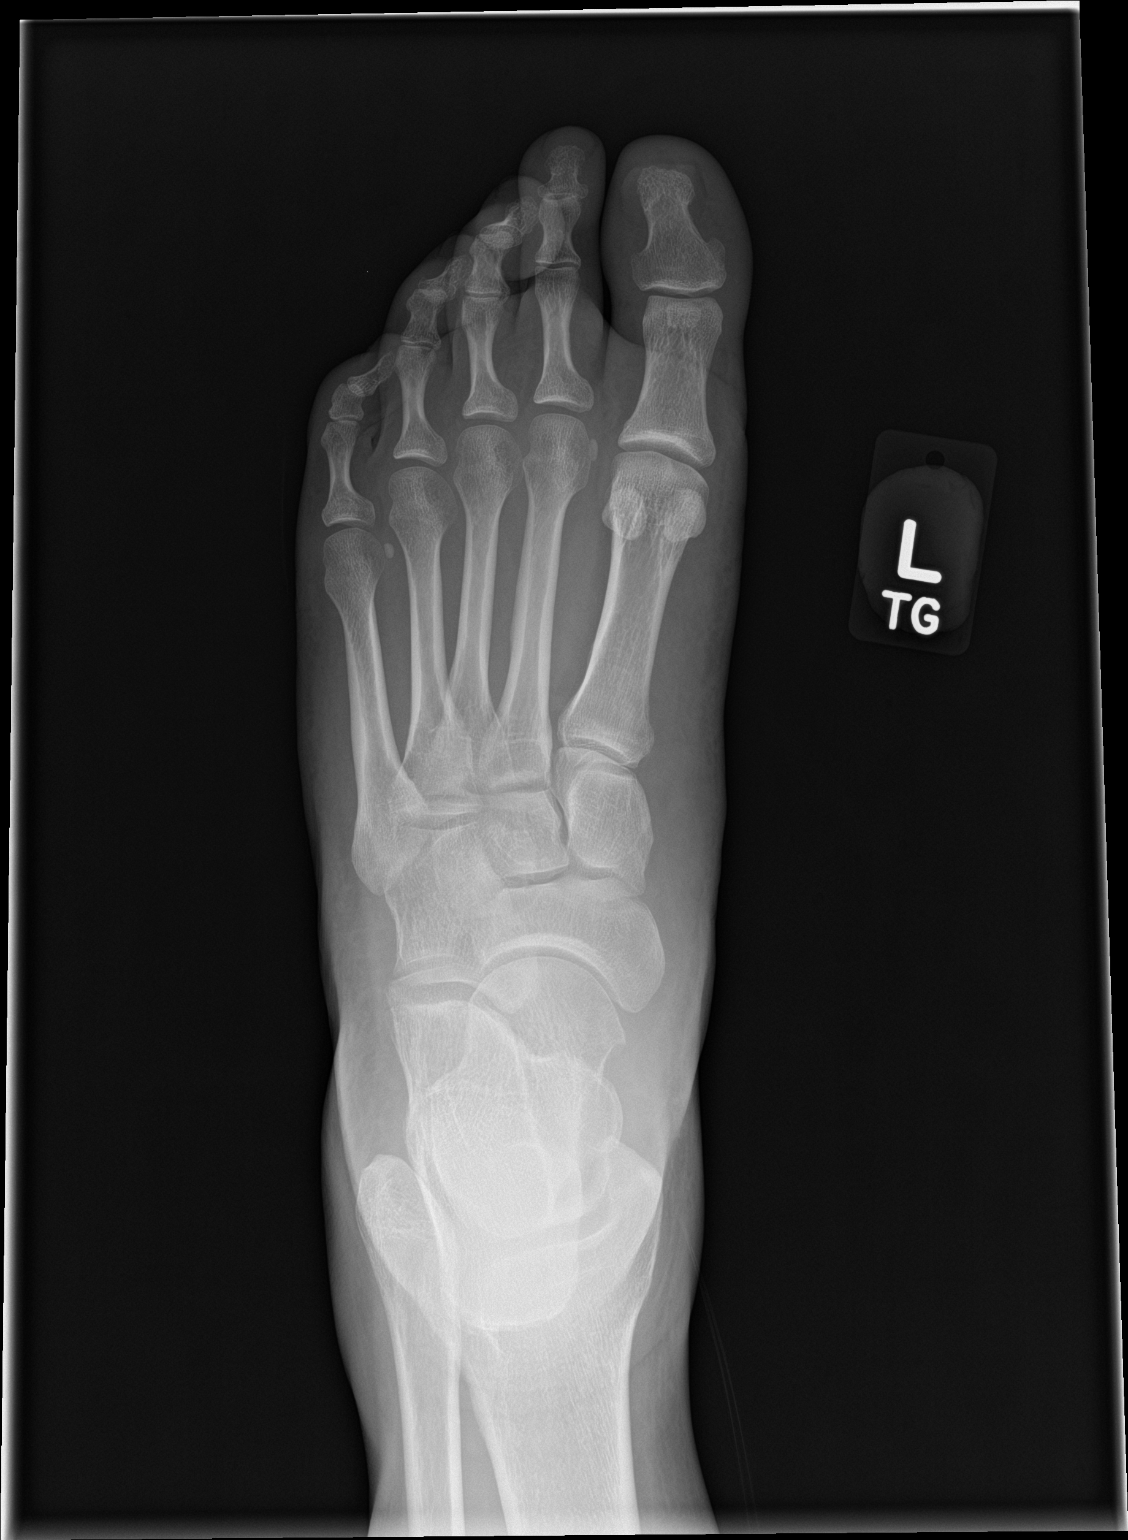

[foot obl]
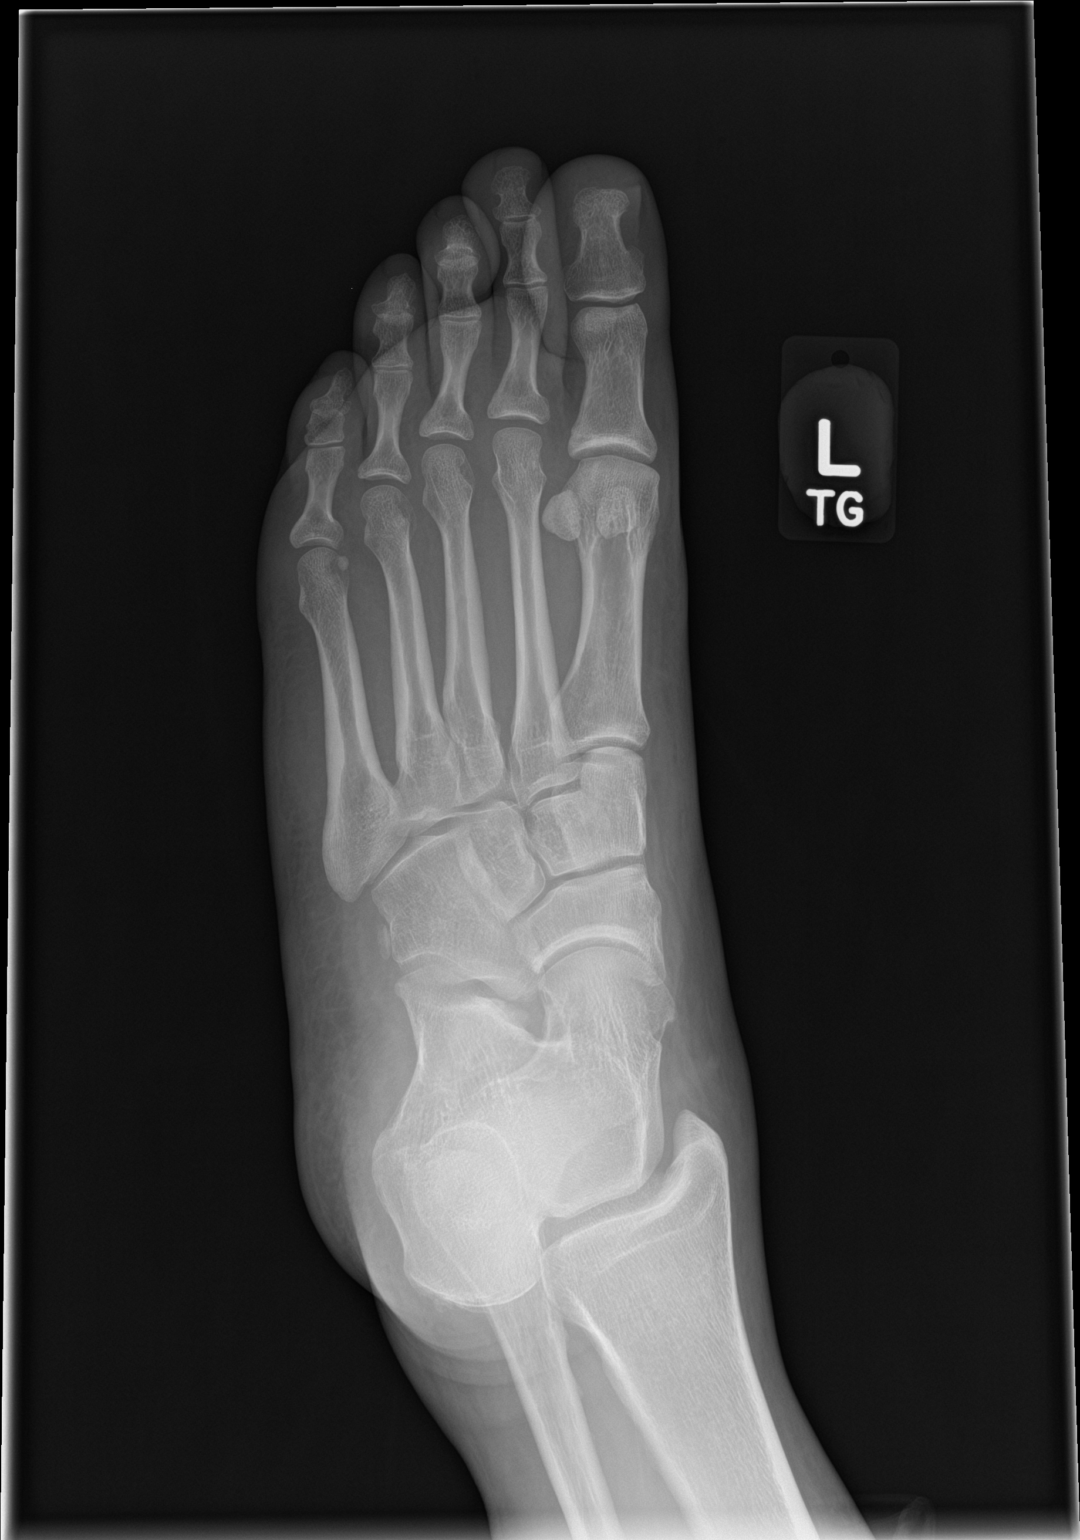

[foot lat]
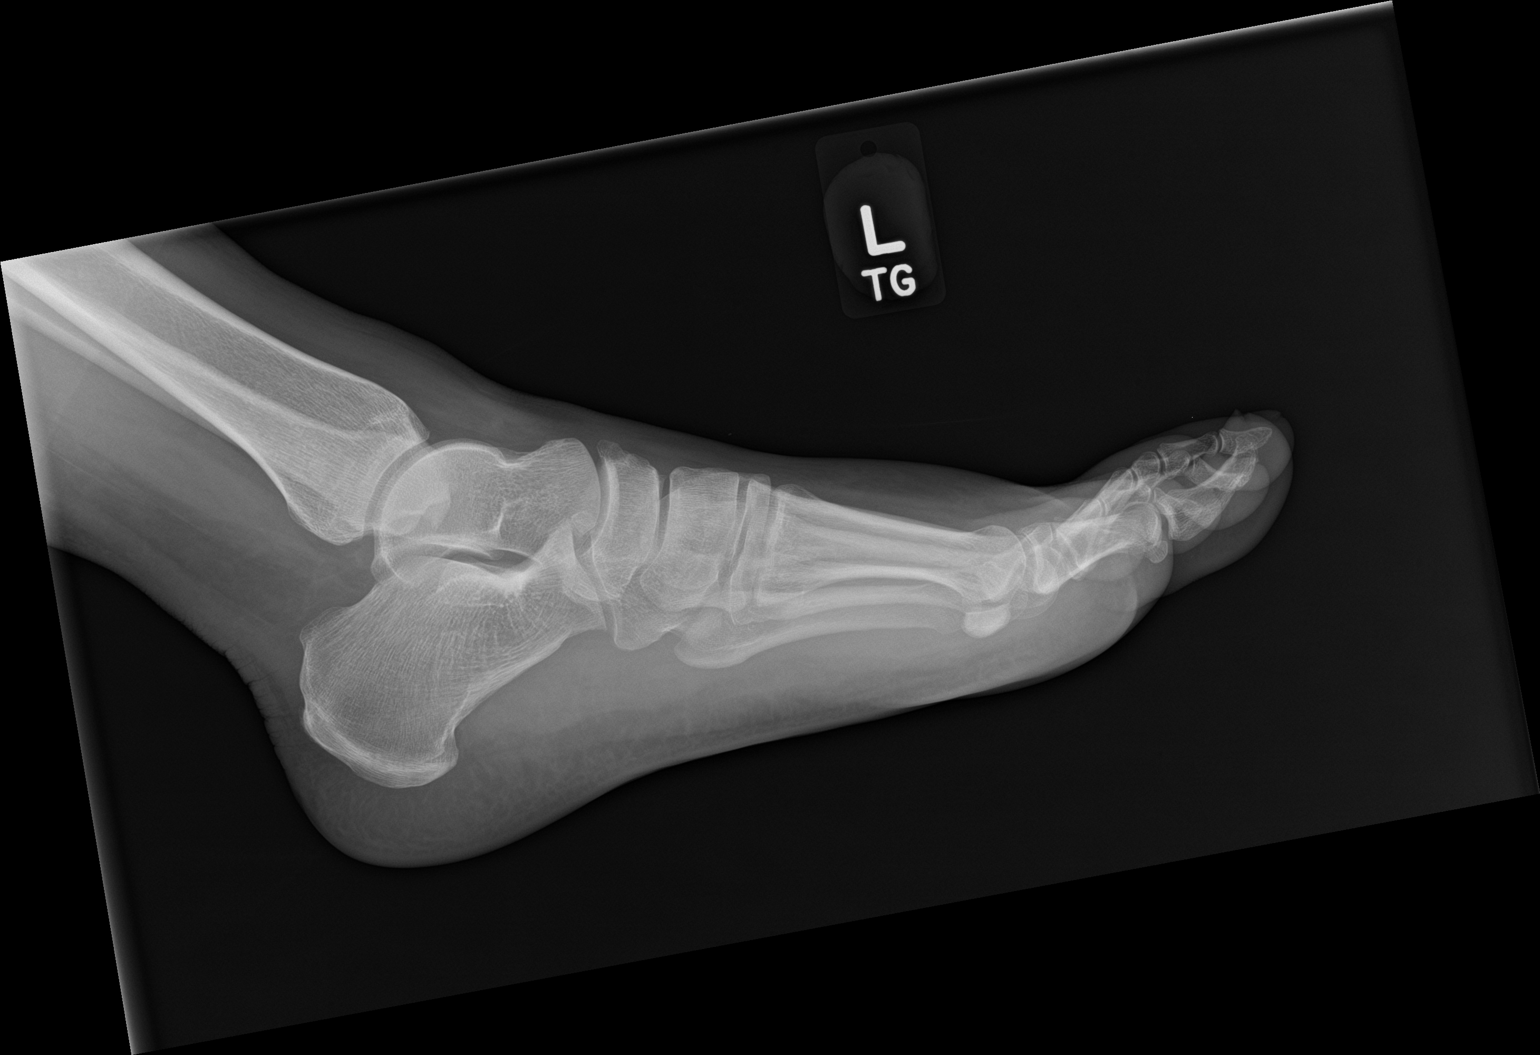

[3 of 3 positions shown; findings below may reference images not displayed]

FINDINGS: There is no evidence of fracture or dislocation. There is no
evidence of arthropathy or other focal bone abnormality. Focal soft
tissue swelling over the dorsal forefoot. No retained radiopaque
foreign body.
IMPRESSION: 1. Mild focal soft tissue swelling over the dorsal forefoot.
2. No evidence of underlying osseous abnormality or retained
radiopaque foreign body.

## 2017-09-04 ENCOUNTER — Other Ambulatory Visit: Payer: Self-pay | Admitting: Obstetrics and Gynecology

## 2017-09-04 DIAGNOSIS — N920 Excessive and frequent menstruation with regular cycle: Secondary | ICD-10-CM | POA: Diagnosis present

## 2017-09-04 DIAGNOSIS — D219 Benign neoplasm of connective and other soft tissue, unspecified: Secondary | ICD-10-CM | POA: Diagnosis present

## 2017-09-04 NOTE — H&P (Signed)
Jennifer Shah is a 40 y.o. 42 yo female G:0 presents for myomectomy because of symptomatic uterine fibroids.  Since 2018 the patient has had  a worsening clot-filled  menstrual flow lasting 6 days with the need to change her overnight pads every 3 hours. With these events she had cramping that  was rated 7/10 on a 10 point pain scale but was relieved with Ibuprofen 800 mg.  At one point the patient's hemoglobin decreased to 6.8 for which she received a blood transfusion followed by #3 IV Iron Infusions.  She was placed on oral contraceptives but this regimen caused her to bleed daily.  At the time the patient was being followed by [facility] and in November 2018, had a pelvic  ultrasound that showed: uterus: 16.83 x 11.48 x 8.07 cm, endometrium: 12.10 mm; #3 fibroids: 7.43 cm, 3.53 cm and 11.19 cm; right ovary-3.27 cm and the  left ovary-was not visualized.  In January 2019 the patient was placed on Lupron Depot 11.25 mg in an effort to decrease her fibroids in  preparation for a minimally invasive surgical intervention.  In April 2019 her hemoglobin was up to 10.7 and a second round of Lupron Depot 11.25 mg  was given.  During this treatment the patient was amenorrheic, Norethindrone 0.35 mg daily was added to her regimen and she denies any vaginitis symptoms, changes in bowel or bladder function and no dyspareunia.  A follow up ultrasound in  July 2019  (after #2 doses of Lupron 11.25 mg)  showed an  anteverted uterus: 8.75 x 11.38 x 6.41 cm (9.6 cm from fundus to external os), endometrium: 1.25 cm; #6 fibroids: Sub-serosal: left lower uterine segment-2.64 cm, right fundal-3.62 cm, right fundal-2.86 cm, anterior mid-intramural-2.83 cm, left posterior pedunculated-6.15 cm and fundal mid-submucosal-2.65 cm; left ovary-2.32 cm and right ovary-2.65 cm.    Given the reduction in fibroid size and consideration of other management options for her symptoms (both medical and surgical)  the patient has decided to  proceed with  a minimally invasive myomectomy with Robot  assistance.    Past Medical History  OB History: G: 0  GYN History: menarche: 42 YO   LMP: see HPI    Contraception:  Norethindrone,   Denies history of abnormal PAP smear.   Last PAP smear: 2018-normal  Medical History: Anemia, Single Episode of Elevated Blood Pressure and Eczema  Surgical History:  None Has a  history of blood transfusions (August 2018)  Family History: Hypertension  Social History: Single and employed as an Therapist, sports; Denies tobacco use but occasionally uses alcohol   Medications: Lupron Depot 11.25 mg - last injection April 2019 Micronor 0.35 mg  daily Zyrtec 5 mg prn  Allergies  Allergen Reactions  . Shellfish Allergy Hives and Swelling    Hives and top lip swells   Denies sensitivity to peanuts, soy, latex or adhesives.  ROS: Admits environmental allergies, constipation with iron supplementation and occasional rash related to eczema but  denies headache, vision changes, nasal congestion, dysphagia, tinnitus, dizziness, hoarseness, cough,  chest pain, shortness of breath, nausea, vomiting, diarrhea,  urinary frequency, urgency  dysuria, hematuria, vaginitis symptoms, pelvic pain, swelling of joints,easy bruising,  myalgias, arthralgias,  unexplained weight loss and except as is mentioned in the history of present illness, patient's review of systems is otherwise negative.   Physical Exam  Bp: 142/78   P: 76 bpm   R: 16   Temperature: 99.5 degrees F orally   Weight: 216 lbs.  Height: 5'6"  BMI: 34.9     Neck: supple without masses or thyromegaly Lungs: clear to auscultation Heart: regular rate and rhythm Abdomen: soft, non-tender and no organomegaly Pelvic:EGBUS- wnl; vagina-normal rugae; uterus-irregular, 12-14 weeks size without tenderness;  cervix without lesions or motion tenderness; adnexae-no tenderness or masses Extremities:  no clubbing, cyanosis or edema   Assesment: Symptomatic Uterine  Fibroids                      Menorrhagia                      History of Severe Anemia (Hgb. 6.8)   Disposition: A discussion was held with the patient regarding the indication for her procedure along with the risk and benefits to include but not limited to: the robotic approach has less postoperative pain, less blood loss during surgery, reduced risk of injury to other organs due to better visualization with a 3-D//HD 10 times magnifying camera, shorter hospital stay between 0-1 night and rapid recovery with return to daily routine in 2-3 weeks. Although a robotic-assisted procedures have a longer operative time than traditional laparotomy, in a patient with good medical history, the benefits usually outweigh the risks. Risks include reaction to anesthesia, excessive bleeding, infection, injury to other organs, need for an open abdominal incision, need for C-section if the endometrial cavity is breached and transient post-operative facial edema.    Patient informed about FDA warning on use of morcellator dated 05/21/2012.   Discussed:  1. Incidence of post-operative diagnosis of sarcoma in women undergoing a hysterectomy is 2:1000  2. Annual incidence of leiomyosarcoma is 0.64/100,000 women  3. Sarcomas have the highest incidence in women over 65  4. Power morcellation involves risks of spreading tissue / disease. In he case of undiagnosed cancer, it may adversely affect the patient's prognosis.   Benefits of the robotic approach include lesser postoperative pain, less blood loss during surgery, reduced risk of injury to other organs due to better visualization with a 3-D HD 10 times magnifying camera, shorter hospital stay between 0-1 night and rapid recovery with return to daily routine in 2-3 weeks. Although robotically-assisted hysterectomy has a longer operative time than traditional laparotomy, in a patient with good medical history, the benefits usually outweigh the risks.     Risks  include bleeding, infection, injury to other organs, need for laparotomy and transient post-operative facial edema,    Patient informed about FDA warning on use of morcellator dated 05/21/2012.   Discussed:   1. Incidence of post-operative diagnosis of sarcoma in women undergoing a hysterectomy is 2:1000  2. Annual incidence of leiomyosarcoma is 0.64/100,000 women  3. Sarcomas have the highest incidence in women over 65  4. Power morcellation involves risks of spreading tissue / disease. In he case of undiagnosed cancer, it may adversely affect the patient's prognosis.   Patient verbalized understanding of risks and pre-operative instructions and desires to proceed as planned with a Robot Assisted Laparoscopic Myomectomy with Fibroid Morcellation at [place] on September 16, 2017 at 7:30 a.m.  CSN# [id number]   [place] J. Florene Glen, PA-C  for Dr.Stepehn Eckard A. Asmi Fugere

## 2017-09-08 NOTE — Patient Instructions (Signed)
Tiye Huwe  09/08/2017   Your procedure is scheduled on: 09-16-17   Report to [facility] Main  Entrance    Report to admitting at 5:30AM    Call this number if you have problems the morning of surgery [phone]     Remember: Do not eat food or drink liquids :After Midnight.     Take these medicines the morning of surgery with A SIP OF WATER: cetirizine(zyrtec) if needed                                You may not have any metal on your body including hair pins and              piercings  Do not wear jewelry, make-up, lotions, powders or perfumes, deodorant             Do not wear nail polish.  Do not shave  48 hours prior to surgery.          Do not bring valuables to the hospital. [place].  Contacts, dentures or bridgework may not be worn into surgery.       Patients discharged the day of surgery will not be allowed to drive home.  Name and phone number of your driver:  Special Instructions: N/A              Please read over the following fact sheets you were given: _____________________________________________________________________             [facility] - Preparing for Surgery Before surgery, you can play an important role.  Because skin is not sterile, your skin needs to be as free of germs as possible.  You can reduce the number of germs on your skin by washing with CHG (chlorahexidine gluconate) soap before surgery.  CHG is an antiseptic cleaner which kills germs and bonds with the skin to continue killing germs even after washing. Please DO NOT use if you have an allergy to CHG or antibacterial soaps.  If your skin becomes reddened/irritated stop using the CHG and inform your nurse when you arrive at Short Stay. Do not shave (including legs and underarms) for at least 48 hours prior to the first CHG shower.  You may shave your face/neck. Please follow these instructions carefully:  1.   Shower with CHG Soap the night before surgery and the  morning of Surgery.  2.  If you choose to wash your hair, wash your hair first as usual with your  normal  shampoo.  3.  After you shampoo, rinse your hair and body thoroughly to remove the  shampoo.                           4.  Use CHG as you would any other liquid soap.  You can apply chg directly  to the skin and wash                       Gently with a scrungie or clean washcloth.  5.  Apply the CHG Soap to your body ONLY FROM THE NECK DOWN.   Do not use on face/ open  Wound or open sores. Avoid contact with eyes, ears mouth and genitals (private parts).                       Wash face,  Genitals (private parts) with your normal soap.             6.  Wash thoroughly, paying special attention to the area where your surgery  will be performed.  7.  Thoroughly rinse your body with warm water from the neck down.  8.  DO NOT shower/wash with your normal soap after using and rinsing off  the CHG Soap.                9.  Pat yourself dry with a clean towel.            10.  Wear clean pajamas.            11.  Place clean sheets on your bed the night of your first shower and do not  sleep with pets. Day of Surgery : Do not apply any lotions/deodorants the morning of surgery.  Please wear clean clothes to the hospital/surgery center.  FAILURE TO FOLLOW THESE INSTRUCTIONS MAY RESULT IN THE CANCELLATION OF YOUR SURGERY PATIENT SIGNATURE_________________________________  NURSE SIGNATURE__________________________________  ________________________________________________________________________

## 2017-09-09 ENCOUNTER — Other Ambulatory Visit: Payer: Self-pay

## 2017-09-09 ENCOUNTER — Encounter (HOSPITAL_COMMUNITY): Payer: Self-pay

## 2017-09-09 ENCOUNTER — Encounter (HOSPITAL_COMMUNITY)
Admission: RE | Admit: 2017-09-09 | Discharge: 2017-09-09 | Disposition: A | Discharge status: Home / Self Care | Payer: Managed Care, Other (non HMO) | Source: Ambulatory Visit | Attending: Obstetrics and Gynecology | Admitting: Obstetrics and Gynecology

## 2017-09-09 DIAGNOSIS — Z01812 Encounter for preprocedural laboratory examination: Secondary | ICD-10-CM | POA: Insufficient documentation

## 2017-09-09 HISTORY — DX: Benign lipomatous neoplasm, unspecified: D17.9

## 2017-09-09 HISTORY — DX: Other allergy status, other than to drugs and biological substances: Z91.09

## 2017-09-09 HISTORY — DX: Benign neoplasm of connective and other soft tissue, unspecified: D21.9

## 2017-09-09 HISTORY — DX: Anemia, unspecified: D64.9

## 2017-09-09 HISTORY — DX: Dysmenorrhea, unspecified: N94.6

## 2017-09-09 LAB — CBC
HCT: 39.3 % (ref 36.0–46.0)
Hemoglobin: 12.1 g/dL (ref 12.0–15.0)
MCH: 25.4 pg — AB (ref 26.0–34.0)
MCHC: 30.8 g/dL (ref 30.0–36.0)
MCV: 82.6 fL (ref 78.0–100.0)
PLATELETS: 317 10*3/uL (ref 150–400)
RBC: 4.76 MIL/uL (ref 3.87–5.11)
RDW: 15.1 % (ref 11.5–15.5)
WBC: 4 10*3/uL (ref 4.0–10.5)

## 2017-09-09 LAB — BASIC METABOLIC PANEL
Anion gap: 7 (ref 5–15)
BUN: 14 mg/dL (ref 6–20)
CHLORIDE: 105 mmol/L (ref 98–111)
CO2: 31 mmol/L (ref 22–32)
CREATININE: 0.76 mg/dL (ref 0.44–1.00)
Calcium: 9.3 mg/dL (ref 8.9–10.3)
GFR calc Af Amer: >60 mL/min (ref >60)
GFR calc non Af Amer: >60 mL/min (ref >60)
Glucose, Bld: 162 mg/dL — ABNORMAL HIGH (ref 70–99)
Potassium: 3.6 mmol/L (ref 3.5–5.1)
SODIUM: 143 mmol/L (ref 135–145)

## 2017-09-09 LAB — ABO/RH: ABO/RH(D): B POS

## 2017-09-09 LAB — PREGNANCY, URINE: Preg Test, Ur: NEGATIVE

## 2017-09-16 ENCOUNTER — Ambulatory Visit (HOSPITAL_COMMUNITY)
Admission: RE | Admit: 2017-09-16 | Discharge: 2017-09-16 | Disposition: A | Discharge status: Home / Self Care | Payer: Managed Care, Other (non HMO) | Source: Ambulatory Visit | Attending: Obstetrics and Gynecology | Admitting: Obstetrics and Gynecology

## 2017-09-16 ENCOUNTER — Ambulatory Visit (HOSPITAL_COMMUNITY): Payer: Managed Care, Other (non HMO) | Admitting: Anesthesiology

## 2017-09-16 ENCOUNTER — Encounter (HOSPITAL_COMMUNITY)
Admission: RE | Disposition: A | Discharge status: Home / Self Care | Payer: Self-pay | Source: Ambulatory Visit | Attending: Obstetrics and Gynecology

## 2017-09-16 ENCOUNTER — Encounter (HOSPITAL_COMMUNITY): Payer: Self-pay | Admitting: *Deleted

## 2017-09-16 DIAGNOSIS — D252 Subserosal leiomyoma of uterus: Secondary | ICD-10-CM | POA: Diagnosis not present

## 2017-09-16 DIAGNOSIS — D259 Leiomyoma of uterus, unspecified: Secondary | ICD-10-CM | POA: Diagnosis present

## 2017-09-16 DIAGNOSIS — Z79899 Other long term (current) drug therapy: Secondary | ICD-10-CM | POA: Diagnosis not present

## 2017-09-16 DIAGNOSIS — D251 Intramural leiomyoma of uterus: Secondary | ICD-10-CM | POA: Insufficient documentation

## 2017-09-16 DIAGNOSIS — D219 Benign neoplasm of connective and other soft tissue, unspecified: Secondary | ICD-10-CM | POA: Diagnosis present

## 2017-09-16 DIAGNOSIS — Z793 Long term (current) use of hormonal contraceptives: Secondary | ICD-10-CM | POA: Insufficient documentation

## 2017-09-16 DIAGNOSIS — N920 Excessive and frequent menstruation with regular cycle: Secondary | ICD-10-CM | POA: Diagnosis present

## 2017-09-16 HISTORY — PX: ROBOT ASSISTED MYOMECTOMY: SHX5142

## 2017-09-16 LAB — TYPE AND SCREEN
ABO/RH(D): B POS
Antibody Screen: NEGATIVE

## 2017-09-16 LAB — PREGNANCY, URINE: Preg Test, Ur: NEGATIVE

## 2017-09-16 SURGERY — MYOMECTOMY, ROBOT-ASSISTED
Anesthesia: General | Laterality: Bilateral

## 2017-09-16 MED ORDER — ONDANSETRON HCL 4 MG PO TABS: 4.0000 mg | ORAL_TABLET | Freq: Three times a day (TID) | ORAL | 0 refills | Status: AC | PRN

## 2017-09-16 MED ORDER — SODIUM CHLORIDE 0.9 % IR SOLN
Status: DC | PRN
  Administered 2017-09-16: 3000 mL

## 2017-09-16 MED ORDER — EPHEDRINE SULFATE 50 MG/ML IJ SOLN
INTRAMUSCULAR | Status: DC | PRN
  Administered 2017-09-16: 10 mg via INTRAVENOUS

## 2017-09-16 MED ORDER — ROPIVACAINE HCL 5 MG/ML IJ SOLN
INTRAMUSCULAR | Status: AC
  Filled 2017-09-16: qty 30

## 2017-09-16 MED ORDER — METHYLENE BLUE 0.5 % INJ SOLN
INTRAVENOUS | Status: AC
Start: 2017-09-16 — End: ?
  Filled 2017-09-16: qty 10

## 2017-09-16 MED ORDER — SODIUM CHLORIDE 0.9 % IJ SOLN
INTRAMUSCULAR | Status: AC
  Filled 2017-09-16: qty 150

## 2017-09-16 MED ORDER — LACTATED RINGERS IV SOLN: INTRAVENOUS | Status: DC

## 2017-09-16 MED ORDER — IBUPROFEN 600 MG PO TABS: ORAL_TABLET | ORAL | 1 refills | Status: DC

## 2017-09-16 MED ORDER — MIDAZOLAM HCL 2 MG/2ML IJ SOLN
INTRAMUSCULAR | Status: AC
  Filled 2017-09-16: qty 2

## 2017-09-16 MED ORDER — ALBUMIN HUMAN 5 % IV SOLN
INTRAVENOUS | Status: DC | PRN
  Administered 2017-09-16: 11:00:00 via INTRAVENOUS

## 2017-09-16 MED ORDER — PHENYLEPHRINE 40 MCG/ML (10ML) SYRINGE FOR IV PUSH (FOR BLOOD PRESSURE SUPPORT)
PREFILLED_SYRINGE | INTRAVENOUS | Status: AC
  Filled 2017-09-16: qty 10

## 2017-09-16 MED ORDER — MENTHOL 3 MG MT LOZG
1.0000 | LOZENGE | OROMUCOSAL | Status: DC | PRN
  Filled 2017-09-16: qty 9

## 2017-09-16 MED ORDER — ONDANSETRON HCL 4 MG/2ML IJ SOLN
INTRAMUSCULAR | Status: AC
  Filled 2017-09-16: qty 2

## 2017-09-16 MED ORDER — PROPOFOL 10 MG/ML IV BOLUS
INTRAVENOUS | Status: DC | PRN
  Administered 2017-09-16: 40 mg via INTRAVENOUS
  Administered 2017-09-16: 160 mg via INTRAVENOUS
  Administered 2017-09-16: 100 mg via INTRAVENOUS

## 2017-09-16 MED ORDER — ONDANSETRON HCL 4 MG/2ML IJ SOLN
4.0000 mg | Freq: Once | INTRAMUSCULAR | Status: AC
  Administered 2017-09-16: 4 mg via INTRAVENOUS

## 2017-09-16 MED ORDER — FENTANYL CITRATE (PF) 100 MCG/2ML IJ SOLN
INTRAMUSCULAR | Status: AC
  Filled 2017-09-16: qty 2

## 2017-09-16 MED ORDER — OXYCODONE-ACETAMINOPHEN 5-325 MG PO TABS: 1.0000 | ORAL_TABLET | Freq: Four times a day (QID) | ORAL | Status: DC | PRN

## 2017-09-16 MED ORDER — PROPOFOL 10 MG/ML IV BOLUS
INTRAVENOUS | Status: AC
  Filled 2017-09-16: qty 40

## 2017-09-16 MED ORDER — IBUPROFEN 200 MG PO TABS: 600.0000 mg | ORAL_TABLET | Freq: Four times a day (QID) | ORAL | Status: DC | PRN

## 2017-09-16 MED ORDER — PHENYLEPHRINE HCL 10 MG/ML IJ SOLN
INTRAMUSCULAR | Status: DC | PRN
  Administered 2017-09-16 (×2): 80 ug via INTRAVENOUS

## 2017-09-16 MED ORDER — EPHEDRINE 5 MG/ML INJ
INTRAVENOUS | Status: AC
  Filled 2017-09-16: qty 10

## 2017-09-16 MED ORDER — MIDAZOLAM HCL 2 MG/2ML IJ SOLN
INTRAMUSCULAR | Status: DC | PRN
  Administered 2017-09-16: 2 mg via INTRAVENOUS

## 2017-09-16 MED ORDER — SCOPOLAMINE 1 MG/3DAYS TD PT72
MEDICATED_PATCH | TRANSDERMAL | Status: DC | PRN
  Administered 2017-09-16: 1 via TRANSDERMAL

## 2017-09-16 MED ORDER — SODIUM CHLORIDE 0.9 % IV SOLN
2.0000 g | INTRAVENOUS | Status: AC
  Administered 2017-09-16: 2 g via INTRAVENOUS
  Filled 2017-09-16: qty 2

## 2017-09-16 MED ORDER — FENTANYL CITRATE (PF) 100 MCG/2ML IJ SOLN
INTRAMUSCULAR | Status: DC | PRN
  Administered 2017-09-16: 50 ug via INTRAVENOUS
  Administered 2017-09-16: 150 ug via INTRAVENOUS

## 2017-09-16 MED ORDER — DEXAMETHASONE SODIUM PHOSPHATE 10 MG/ML IJ SOLN
INTRAMUSCULAR | Status: AC
  Filled 2017-09-16: qty 1

## 2017-09-16 MED ORDER — SODIUM CHLORIDE 0.9 % IJ SOLN
INTRAMUSCULAR | Status: AC
  Filled 2017-09-16: qty 10

## 2017-09-16 MED ORDER — ONDANSETRON HCL 4 MG/2ML IJ SOLN
INTRAMUSCULAR | Status: DC | PRN
  Administered 2017-09-16: 4 mg via INTRAVENOUS

## 2017-09-16 MED ORDER — ROCURONIUM BROMIDE 10 MG/ML (PF) SYRINGE
PREFILLED_SYRINGE | INTRAVENOUS | Status: DC | PRN
  Administered 2017-09-16: 10 mg via INTRAVENOUS
  Administered 2017-09-16: 50 mg via INTRAVENOUS
  Administered 2017-09-16 (×3): 20 mg via INTRAVENOUS
  Administered 2017-09-16: 10 mg via INTRAVENOUS

## 2017-09-16 MED ORDER — MEPERIDINE HCL 50 MG/ML IJ SOLN: 6.2500 mg | INTRAMUSCULAR | Status: DC | PRN

## 2017-09-16 MED ORDER — VASOPRESSIN 20 UNIT/ML IV SOLN
INTRAVENOUS | Status: AC
Start: 2017-09-16 — End: ?
  Filled 2017-09-16: qty 1

## 2017-09-16 MED ORDER — LIDOCAINE 2% (20 MG/ML) 5 ML SYRINGE
INTRAMUSCULAR | Status: DC | PRN
  Administered 2017-09-16: 100 mg via INTRAVENOUS

## 2017-09-16 MED ORDER — ONDANSETRON HCL 4 MG/2ML IJ SOLN: 4.0000 mg | Freq: Once | INTRAMUSCULAR | Status: DC | PRN

## 2017-09-16 MED ORDER — SCOPOLAMINE 1 MG/3DAYS TD PT72
MEDICATED_PATCH | TRANSDERMAL | Status: AC
  Filled 2017-09-16: qty 1

## 2017-09-16 MED ORDER — SODIUM CHLORIDE 0.9 % IV SOLN
INTRAVENOUS | Status: DC | PRN
  Administered 2017-09-16: 10 mL
  Administered 2017-09-16: 60 mL
  Administered 2017-09-16: 50 mL

## 2017-09-16 MED ORDER — FENTANYL CITRATE (PF) 250 MCG/5ML IJ SOLN
INTRAMUSCULAR | Status: AC
  Filled 2017-09-16: qty 5

## 2017-09-16 MED ORDER — LACTATED RINGERS IV SOLN
INTRAVENOUS | Status: DC
  Administered 2017-09-16 (×2): via INTRAVENOUS

## 2017-09-16 MED ORDER — SUGAMMADEX SODIUM 200 MG/2ML IV SOLN
INTRAVENOUS | Status: DC | PRN
  Administered 2017-09-16: 200 mg via INTRAVENOUS

## 2017-09-16 MED ORDER — SODIUM CHLORIDE 0.9 % IV SOLN
INTRAVENOUS | Status: DC | PRN
  Administered 2017-09-16: 72 mL via INTRAMUSCULAR

## 2017-09-16 MED ORDER — ALBUMIN HUMAN 5 % IV SOLN
INTRAVENOUS | Status: AC
  Filled 2017-09-16: qty 250

## 2017-09-16 MED ORDER — ONDANSETRON HCL 4 MG PO TABS
4.0000 mg | ORAL_TABLET | Freq: Three times a day (TID) | ORAL | Status: DC | PRN
  Filled 2017-09-16: qty 1

## 2017-09-16 MED ORDER — DEXAMETHASONE SODIUM PHOSPHATE 10 MG/ML IJ SOLN
INTRAMUSCULAR | Status: DC | PRN
  Administered 2017-09-16: 10 mg via INTRAVENOUS

## 2017-09-16 MED ORDER — HYDROMORPHONE HCL 1 MG/ML IJ SOLN: 0.2500 mg | INTRAMUSCULAR | Status: DC | PRN

## 2017-09-16 MED ORDER — OXYCODONE-ACETAMINOPHEN 5-325 MG PO TABS: 1.0000 | ORAL_TABLET | Freq: Four times a day (QID) | ORAL | 0 refills | Status: AC | PRN

## 2017-09-16 MED ORDER — KETOROLAC TROMETHAMINE 30 MG/ML IJ SOLN
INTRAMUSCULAR | Status: DC | PRN
  Administered 2017-09-16: 30 mg via INTRAVENOUS

## 2017-09-16 SURGICAL SUPPLY — 58 items
ADH SKN CLS APL DERMABOND .7 (GAUZE/BANDAGES/DRESSINGS) ×1
BARRIER ADHS 3X4 INTERCEED (GAUZE/BANDAGES/DRESSINGS) ×6 IMPLANT
BLADE MORCELLATOR EXT  12.5X15 (ELECTROSURGICAL) ×2
BLADE MORCELLATOR EXT 12.5X15 (ELECTROSURGICAL) ×1 IMPLANT
CATH FOLEY 3WAY  5CC 16FR (CATHETERS) ×2
CATH FOLEY 3WAY 5CC 16FR (CATHETERS) ×1 IMPLANT
CLOSURE WOUND 1/4 X3 (GAUZE/BANDAGES/DRESSINGS) ×1
CONT PATH 16OZ SNAP LID 3702 (MISCELLANEOUS) ×3 IMPLANT
COVER BACK TABLE 60X90IN (DRAPES) ×6 IMPLANT
COVER TIP SHEARS 8 DVNC (MISCELLANEOUS) ×1 IMPLANT
COVER TIP SHEARS 8MM DA VINCI (MISCELLANEOUS) ×2
DEFOGGER SCOPE WARMER CLEARIFY (MISCELLANEOUS) ×3 IMPLANT
DERMABOND ADVANCED (GAUZE/BANDAGES/DRESSINGS) ×2
DERMABOND ADVANCED .7 DNX12 (GAUZE/BANDAGES/DRESSINGS) ×1 IMPLANT
DRAPE ARM DVNC X/XI (DISPOSABLE) ×4 IMPLANT
DRAPE COLUMN DVNC XI (DISPOSABLE) ×1 IMPLANT
DRAPE DA VINCI XI ARM (DISPOSABLE) ×8
DRAPE DA VINCI XI COLUMN (DISPOSABLE) ×2
DRSG OPSITE POSTOP 3X4 (GAUZE/BANDAGES/DRESSINGS) ×3 IMPLANT
DURAPREP 26ML APPLICATOR (WOUND CARE) ×3 IMPLANT
ELECT REM PT RETURN 15FT ADLT (MISCELLANEOUS) ×3 IMPLANT
GLOVE BIOGEL PI IND STRL 7.0 (GLOVE) ×5 IMPLANT
GLOVE BIOGEL PI INDICATOR 7.0 (GLOVE) ×10
GLOVE ECLIPSE 6.5 STRL STRAW (GLOVE) ×9 IMPLANT
IRRIG SUCT STRYKERFLOW 2 WTIP (MISCELLANEOUS) ×3
IRRIGATION SUCT STRKRFLW 2 WTP (MISCELLANEOUS) ×1 IMPLANT
LEGGING LITHOTOMY PAIR STRL (DRAPES) ×3 IMPLANT
NEEDLE SPNL 22GX7 QUINCKE BK (NEEDLE) ×3 IMPLANT
NS IRRIG 1000ML POUR BTL (IV SOLUTION) ×9 IMPLANT
OBTURATOR OPTICAL STANDARD 8MM (TROCAR) ×2
OBTURATOR OPTICAL STND 8 DVNC (TROCAR) ×1
OBTURATOR OPTICALSTD 8 DVNC (TROCAR) ×1 IMPLANT
OCCLUDER COLPOPNEUMO (BALLOONS) ×3 IMPLANT
PACK ROBOT WH (CUSTOM PROCEDURE TRAY) ×3 IMPLANT
PACK ROBOTIC GOWN (GOWN DISPOSABLE) ×3 IMPLANT
PACK TRENDGUARD 450 HYBRID PRO (MISCELLANEOUS) ×1 IMPLANT
PAD PREP 24X48 CUFFED NSTRL (MISCELLANEOUS) ×3 IMPLANT
POSITIONER SURGICAL ARM (MISCELLANEOUS) ×9 IMPLANT
SEAL CANN UNIV 5-8 DVNC XI (MISCELLANEOUS) ×3 IMPLANT
SEAL XI 5MM-8MM UNIVERSAL (MISCELLANEOUS) ×6
SET CYSTO W/LG BORE CLAMP LF (SET/KITS/TRAYS/PACK) IMPLANT
SET TRI-LUMEN FLTR TB AIRSEAL (TUBING) ×3 IMPLANT
STRIP CLOSURE SKIN 1/4X3 (GAUZE/BANDAGES/DRESSINGS) ×2 IMPLANT
SUT DVC VLOC 180 0 12IN GS21 (SUTURE)
SUT DVC VLOC 180 2-0 12IN GS21 (SUTURE)
SUT MNCRL AB 3-0 PS2 27 (SUTURE) ×6 IMPLANT
SUT VIC AB 2-0 SH 27 (SUTURE) ×2
SUT VIC AB 2-0 SH 27X BRD (SUTURE) ×1 IMPLANT
SUT VICRYL 0 UR6 27IN ABS (SUTURE) ×6 IMPLANT
SUT VLOC 180 0 9IN  GS21 (SUTURE) ×8
SUT VLOC 180 0 9IN GS21 (SUTURE) ×4 IMPLANT
SUT VLOC 180 2-0 6IN GS21 (SUTURE) ×12 IMPLANT
SUTURE DVC VL 180 2-0 12INGS21 (SUTURE) IMPLANT
SUTURE DVC VLC 180 0 12IN GS21 (SUTURE) IMPLANT
TIP UTERINE 6.7X8CM BLUE DISP (MISCELLANEOUS) ×3 IMPLANT
TOWEL OR 17X26 10 PK STRL BLUE (TOWEL DISPOSABLE) ×3 IMPLANT
TRENDGUARD 450 HYBRID PRO PACK (MISCELLANEOUS) ×3
TROCAR PORT AIRSEAL 8X120 (TROCAR) ×3 IMPLANT

## 2017-09-16 NOTE — Discharge Instructions (Signed)
Call [facility] @ [phone] if:  You have a temperature greater than or equal to 100.4 degrees Farenheit orally You have pain that is not made better by the pain medication given and taken as directed You have excessive bleeding or problems urinating  Take Colace (Docusate Sodium/Stool Softener) 100 mg 2-3 times daily while taking narcotic pain medicine to avoid constipation or until bowel movements are regular. Take Ibuprofen 600 mg with food every 6 hours for 5 days then as needed for pain  You may drive after 1 week You may walk up steps  You may shower tomorrow You may resume a regular diet  Keep incisions clean and dry Avoid anything in vagina for 6 weeks until after your post-operative visit

## 2017-09-16 NOTE — Op Note (Signed)
Preoperative diagnosis: Symptomatic uterine fibroids   Postoperative diagnosis: Same, minimal endometriosis  Anesthesia: General   Anesthesiologist: Dr. Conrad [place]  Procedure: Robotically assisted myomectomy and cauterization of endometriosis  Surgeon: Dr. Katharine Look Devann Cribb   Assistant: Earnstine Regal P.A.-C .  Estimated blood loss: 50 cc   Procedure:   After being informed of the planned procedure with possible complications including but not limited to bleeding, infection, injury to other organs, need for laparotomy, possible need for morcellation with risks and benefits reviewed, expected hospital stay and recovery, informed consent is obtained and patient is taken to or #6. She is placed in lithotomy position on Treng[place] with both arms padded and tucked on each side and bilateral knee-high sequential compressive devices. She is given general anesthesia with endotracheal intubation without any complication. She is prepped and draped in a sterile fashion. A three-way Foley catheter is inserted in her bladder.   Pelvic exam reveals: 14 week irregular uterus with 2 normal adnexa  A weighted speculum is inserted in the vagina and the anterior lip of the cervix is grasped with a tenaculum forcep. We proceed with a paracervical block and vaginal infiltration using ropivacaine 0.5% diluted 1 in 1 with saline. The uterus was then sounded at 9 cm. We easily dilate the cervix using Hegar dilator to #27 which allows for easy placement of the intrauterine RUMI manipulator.   Trocar placement is decided. We infiltrate 6 cm above the umbilicus with 10 cc of ropivacaine per protocol and perform a 10 mm vertical incision which is brought down bluntly to the fascia. The fascia is identified and grasped with Coker forceps. The fascia is incised with Mayo scissors. Peritoneum is entered bluntly. A pursestring suture of 0 Vicryl is placed on the fascia and a 10 mm Hassan trocar is easily inserted in the abdominal  cavity held in placed with a Purstring suture. This allows for easy insufflation of a pneumoperitoneum using warmed CO2 at a maximum pressure of 15 mm of mercury. 60 cc of Ropivacaine 0.5 % diluted 1 in 1 is sent in the pelvis and the patient is positioned in reverse Trendelenburg. We then placed two 64mm robotic trocar on the left, one 31mm robotic trocar on the right and one 10 mm patient's side assistant trocar on the right after infiltrating every site with ropivacaine per protocol. The robot is docked on the left of the patient after positioning her in Trendelenburg. A monopolar scissor is inserted in arm #4, a Long bipolar forceps is inserted in arm #2 and a Tenaculum is inserted in arm #1  Preparation and docking is completed in 62 minutes.   Observation: The uterus is enlarged with multiple fibroids as follows: #1 is a 7 cm posterior left at the cornua, #2 is a 4 cm posterior and pedunculated fibroid, #3 is a 3.5 cm posterior lower uterine intramural fibroid, #4 is a 0.2 cm subserosal posterior fibroid, #5 is a 3 cm fundal intramural fibroid, #6 is a 5 cm fundal subserosal fibroid, #7 is a 3 cm fundal subserosal fibroid #8 is a 2.5 cm fundal anterior subserosal fibroid, #9 is a 2 cm subserosal cornual fibroid, #10 is a 3 cm subserosal pedunculated fibroid at the fundus, #11 is a 3 cm subserosal anterior lower uterine segment fibroid . Both tubes and ovaries are normal. Both anterior and posterior cul-de-sac are normal except for 2 small dark lesions of endometriosis near the left uterosacral ligament ligament for a area of 1 cm. Liver is visualized and normal.  Appendix is not visualized.   We proceed by infiltrating all the previously described fibroids using vasopressin at a dilution of 20 units in 100 cc of saline until we obtain complete blanching. Using monopolar scissors, the serosa of each fibroid is incised. The fibroid is grasped with Tenaculum. Using sharp and blunt dissection, the fibroid is  extracted from the myometrium.   The myometrial defects are repaired in multiple layers of running sutures of 0 V-Lock. All serosal defects are closed with a baseball suture of 2-0 V-Lock.in total we perform 4 incisions which require myometrial closure and 4 incisions that are only serosal. The posterior incision goes through the full myometrial depth and into the endometrial cavity.   We irrigated profusely to confirm a satisfactory hemostasis. Two sheets of Interceed are used to wrap the uterus front and back. Console  time is completed in 2 hour and 53 minutes.   All instruments are then removed and the robot is undocked.   The patient side assistant 10 mm trocar is replaced with the morcellator under direct visualization. We then proceed with systematic morcellation of the specimen which takes 29 minutes.   We irrigated profusely to again confirm a satisfactory hemostasis.   All trochars are removed under direct visualization after evacuating the pneumoperitoneum.   The fascia of the supraumbilical incision is closed with the previously placed pursestring suture of 0 Vicryl. the fascia of the right patient side assistant trocar is closed with a figure-of-eight stitch of 0 Vicryl. All incisions are then closed with subcuticular suture of 3-0 Monocryl and Dermabond.   A speculum is inserted in the vagina to confirm adequate hemostasis on the cervix.   Instrument and sponge count is complete x2. Estimated blood loss is 50 cc.   The procedure is well tolerated by the patient who  is taken to recovery room in a well and stable condition.   NUMBER OF FIBROIDS REMOVED: 11  NUMBER OF MYOMETRIAL INCISIONS: 8  ENDOMETRIAL CAVITY ENTRY: yes  CESAREAN DELIVERY RECOMMENDED: yes  Specimen: Fibroids sent to pathology.

## 2017-09-16 NOTE — Progress Notes (Signed)
Prior to discharge home, patient's b/p was 174/85, oral temp 99.9. Patient denies acute pain. Dr. Cletis Media notified, no new orders obtained, stated patient is stable to discharge home. MD states to instruct patient to follow up with PCP regarding HTN and take ibuprofen as prescribed for fever. Patient advised, verbalized understanding.

## 2017-09-16 NOTE — Anesthesia Procedure Notes (Signed)
Procedure Name: Intubation Date/Time: 09/16/2017 7:46 AM Performed by: Wanita Chamberlain, CRNA Pre-anesthesia Checklist: Timeout performed, Patient being monitored, Suction available, Emergency Drugs available and Patient identified Patient Re-evaluated:Patient Re-evaluated prior to induction Oxygen Delivery Method: Circle system utilized Preoxygenation: Pre-oxygenation with 100% oxygen Induction Type: IV induction Ventilation: Mask ventilation without difficulty Laryngoscope Size: Mac and 3 Grade View: Grade I Tube type: Oral Tube size: 7.0 mm Number of attempts: 1 Airway Equipment and Method: Stylet Placement Confirmation: breath sounds checked- equal and bilateral,  CO2 detector,  positive ETCO2 and ETT inserted through vocal cords under direct vision Secured at: 22 cm Tube secured with: Tape Dental Injury: Teeth and Oropharynx as per pre-operative assessment

## 2017-09-16 NOTE — Transfer of Care (Signed)
Immediate Anesthesia Transfer of Care Note  Patient: Jennifer Shah  Procedure(s) Performed: XI ROBOTIC ASSISTED MYOMECTOMY (Bilateral )  Patient Location: PACU  Anesthesia Type:General  Level of Consciousness: sedated and patient cooperative  Airway & Oxygen Therapy: Patient Spontanous Breathing and Patient connected to face mask  Post-op Assessment: Report given to RN and Post -op Vital signs reviewed and stable  Post vital signs: Reviewed and stable  Last Vitals:  Vitals Value Taken Time  BP 167/68 09/16/2017  1:42 PM  Temp    Pulse 88 09/16/2017  1:45 PM  Resp    SpO2 98 % 09/16/2017  1:45 PM  Vitals shown include unvalidated device data.  Last Pain: There were no vitals filed for this visit.       Complications: No apparent anesthesia complications

## 2017-09-16 NOTE — Anesthesia Postprocedure Evaluation (Signed)
Anesthesia Post Note  Patient: Shanoah Asbill  Procedure(s) Performed: XI ROBOTIC ASSISTED MYOMECTOMY (Bilateral )     Patient location during evaluation: PACU Anesthesia Type: General Level of consciousness: awake and alert Pain management: pain level controlled Vital Signs Assessment: post-procedure vital signs reviewed and stable Respiratory status: spontaneous breathing, nonlabored ventilation, respiratory function stable and patient connected to nasal cannula oxygen Cardiovascular status: blood pressure returned to baseline and stable Postop Assessment: no apparent nausea or vomiting Anesthetic complications: no    Last Vitals:  Vitals:   09/16/17 1540 09/16/17 1645  BP: (!) 161/77 (!) 167/85  Pulse: 94 92  Resp: 20 19  Temp: 37.2 C 37.6 C  SpO2: 97% 98%    Last Pain:  Vitals:   09/16/17 1645  TempSrc: Oral  PainSc:                  Anvita Hirata DAVID

## 2017-09-16 NOTE — Anesthesia Preprocedure Evaluation (Addendum)
Anesthesia Evaluation  Patient identified by MRN, date of birth, ID band Patient awake    Reviewed: Allergy & Precautions, NPO status , Patient's Chart, lab work & pertinent test results  Airway Mallampati: I  TM Distance: >3 FB Neck ROM: Full    Dental  (+) Teeth Intact, Dental Advisory Given   Pulmonary neg pulmonary ROS,    Pulmonary exam normal        Cardiovascular negative cardio ROS Normal cardiovascular exam Rhythm:Regular Rate:Normal     Neuro/Psych negative neurological ROS  negative psych ROS   GI/Hepatic negative GI ROS, Neg liver ROS,   Endo/Other  negative endocrine ROS  Renal/GU negative Renal ROS  negative genitourinary   Musculoskeletal negative musculoskeletal ROS (+)   Abdominal   Peds  Hematology  (+) anemia ,   Anesthesia Other Findings   Reproductive/Obstetrics Uterine fibroids                          Anesthesia Physical Anesthesia Plan  ASA: II  Anesthesia Plan: General   Post-op Pain Management:    Induction:   PONV Risk Score and Plan: 3 and Ondansetron, Midazolam and Dexamethasone  Airway Management Planned: Oral ETT  Additional Equipment:   Intra-op Plan:   Post-operative Plan: Extubation in OR  Informed Consent: I have reviewed the patients History and Physical, chart, labs and discussed the procedure including the risks, benefits and alternatives for the proposed anesthesia with the patient or authorized representative who has indicated his/her understanding and acceptance.   Dental advisory given  Plan Discussed with: CRNA and Surgeon  Anesthesia Plan Comments:        Anesthesia Quick Evaluation

## 2017-09-16 NOTE — Interval H&P Note (Signed)
History and Physical Interval Note:  09/16/2017 7:24 AM  Jennifer Shah  has presented today for surgery, with the diagnosis of Uterine Fibroids  The various methods of treatment have been discussed with the patient and family. After consideration of risks, benefits and other options for treatment, the patient has consented to  Procedure(s): XI ROBOTIC ASSISTED MYOMECTOMY (Bilateral) as a surgical intervention .  The patient's history has been reviewed, patient examined, no change in status, stable for surgery.  I have reviewed the patient's chart and labs.  Questions were answered to the patient's satisfaction.     Katharine Look A [name]

## 2017-09-17 ENCOUNTER — Encounter (HOSPITAL_COMMUNITY): Payer: Self-pay | Admitting: Obstetrics and Gynecology

## 2017-09-21 ENCOUNTER — Emergency Department (HOSPITAL_BASED_OUTPATIENT_CLINIC_OR_DEPARTMENT_OTHER)
Admission: EM | Admit: 2017-09-21 | Discharge: 2017-09-21 | Disposition: A | Discharge status: Home / Self Care | Payer: Managed Care, Other (non HMO) | Attending: Emergency Medicine | Admitting: Emergency Medicine

## 2017-09-21 ENCOUNTER — Encounter: Payer: Self-pay | Admitting: Emergency Medicine

## 2017-09-21 ENCOUNTER — Encounter (HOSPITAL_BASED_OUTPATIENT_CLINIC_OR_DEPARTMENT_OTHER): Payer: Self-pay | Admitting: *Deleted

## 2017-09-21 ENCOUNTER — Emergency Department (HOSPITAL_BASED_OUTPATIENT_CLINIC_OR_DEPARTMENT_OTHER)
Admit: 2017-09-21 | Discharge: 2017-09-21 | Disposition: A | Discharge status: Home / Self Care | Payer: Managed Care, Other (non HMO) | Attending: Family Medicine | Admitting: Family Medicine

## 2017-09-21 ENCOUNTER — Emergency Department
Admission: EM | Admit: 2017-09-21 | Discharge: 2017-09-21 | Payer: Managed Care, Other (non HMO) | Source: Home / Self Care | Attending: Family Medicine | Admitting: Family Medicine

## 2017-09-21 ENCOUNTER — Other Ambulatory Visit: Payer: Self-pay

## 2017-09-21 DIAGNOSIS — Z79899 Other long term (current) drug therapy: Secondary | ICD-10-CM | POA: Diagnosis not present

## 2017-09-21 DIAGNOSIS — M79662 Pain in left lower leg: Secondary | ICD-10-CM | POA: Diagnosis not present

## 2017-09-21 DIAGNOSIS — I82442 Acute embolism and thrombosis of left tibial vein: Secondary | ICD-10-CM | POA: Insufficient documentation

## 2017-09-21 LAB — BASIC METABOLIC PANEL
ANION GAP: 9 (ref 5–15)
BUN: 13 mg/dL (ref 6–20)
CALCIUM: 8.9 mg/dL (ref 8.9–10.3)
CO2: 28 mmol/L (ref 22–32)
CREATININE: 0.66 mg/dL (ref 0.44–1.00)
Chloride: 102 mmol/L (ref 98–111)
GFR calc non Af Amer: >60 mL/min (ref >60)
GLUCOSE: 101 mg/dL — AB (ref 70–99)
Potassium: 3.8 mmol/L (ref 3.5–5.1)
Sodium: 139 mmol/L (ref 135–145)

## 2017-09-21 MED ORDER — RIVAROXABAN 15 MG PO TABS
15.0000 mg | ORAL_TABLET | Freq: Once | ORAL | Status: AC
  Administered 2017-09-21: 15 mg via ORAL
  Filled 2017-09-21: qty 1

## 2017-09-21 MED ORDER — RIVAROXABAN (XARELTO) VTE STARTER PACK (15 & 20 MG): ORAL_TABLET | ORAL | 0 refills | Status: AC

## 2017-09-21 NOTE — ED Provider Notes (Signed)
[facility]    CSN: [id number] Arrival date & time: 09/21/17  1100     History   Chief Complaint Chief Complaint  Patient presents with  . Leg Problem    left lower    HPI Jennifer Shah is a 42 y.o. female.   Patient has history of uterine leiomyoma and underwent robotic assisted myomectomy 6 days ago.  Shortly after the procedure she developed left lower leg and calf pain that has persisted, and she has pain with weight bearing.  Her left lower leg has been mildly swollen below the knee.  She is concerned about a possible DVT.  She denies past history of DVT.  No chest pain or shortness of breath.  The history is provided by the patient.    Past Medical History:  Diagnosis Date  . Anemia   . Dysmenorrhea   . Environmental allergies   . Fibroids    uterine   . Lipoma    left side posterior thigh     Patient Active Problem List   Diagnosis Date Noted  . Fibroid, uterine 09/16/2017  . Fibroids 09/04/2017  . Menorrhagia 09/04/2017  . Palpitations 12/05/2014  . Obesity 12/05/2014  . BREAST PAIN, BILATERAL 10/17/2009  . ELEVATED BLOOD PRESSURE WITHOUT DIAGNOSIS OF HYPERTENSION 10/17/2009    Past Surgical History:  Procedure Laterality Date  . MYOMECTOMY     09/16/17  . ROBOT ASSISTED MYOMECTOMY Bilateral 09/16/2017   Procedure: XI ROBOTIC ASSISTED MYOMECTOMY;  Surgeon: Delsa Bern, MD;  Location: [facility];  Service: Gynecology;  Laterality: Bilateral;  . WISDOM TOOTH EXTRACTION      OB History   None      Home Medications    Prior to Admission medications   Medication Sig Start Date End Date Taking? Authorizing Provider  metoprolol succinate (TOPROL-XL) 25 MG 24 hr tablet Take 25 mg by mouth daily.   Yes [provider]  cetirizine (ZYRTEC) 10 MG tablet Take 10 mg by mouth daily as needed for allergies.     [provider]  ferrous sulfate 325 (65 FE) MG tablet Take 325 mg by mouth daily with breakfast.    [provider]  ibuprofen (ADVIL,MOTRIN) 600 MG tablet 1 po  pc   every 6 hours for 5 days then as needed for pain 09/16/17   Earnstine Regal, PA-C  norethindrone (MICRONOR,CAMILA,ERRIN) 0.35 MG tablet Take 1 tablet by mouth daily.    [provider]  ondansetron (ZOFRAN) 4 MG tablet Take 1 tablet (4 mg total) by mouth every 8 (eight) hours as needed for nausea or vomiting. 09/16/17   Earnstine Regal, PA-C  oxyCODONE-acetaminophen (PERCOCET) 5-325 MG tablet Take 1 tablet by mouth every 6 (six) hours as needed for severe pain. 09/16/17 09/16/18  Earnstine Regal, PA-C    Family History Family History  Problem Relation Age of Onset  . Hypertension Mother   . Glaucoma Father     Social History Social History   Tobacco Use  . Smoking status: Never Smoker  . Smokeless tobacco: Never Used  Substance Use Topics  . Alcohol use: Yes    Comment: occ wine   . Drug use: No     Allergies   Shellfish allergy   Review of Systems Review of Systems  Constitutional: Negative for activity change, chills, diaphoresis, fatigue and fever.  HENT: Negative.   Eyes: Negative.   Respiratory: Negative for cough, chest tightness, shortness of breath, wheezing and stridor.   Cardiovascular: Positive for  leg swelling. Negative for chest pain and palpitations.  Musculoskeletal: Negative.   Skin: Negative.   Neurological: Negative for facial asymmetry, speech difficulty, numbness and headaches.  All other systems reviewed and are negative.    Physical Exam Triage Vital Signs ED Triage Vitals  Enc Vitals Group     BP 09/21/17 1117 (!) 142/83     Pulse Rate 09/21/17 1117 60     Resp 09/21/17 1117 16     Temp 09/21/17 1117 98.5 F (36.9 C)     Temp Source 09/21/17 1117 Oral     SpO2 09/21/17 1117 100 %     Weight 09/21/17 1118 220 lb (99.8 kg)     Height 09/21/17 1118 5\' 5"  (1.651 m)     Head Circumference --      Peak Flow --      Pain Score 09/21/17 1118 0     Pain Loc --      Pain Edu?  --      Excl. in [place]? --    No data found.  Updated Vital Signs BP (!) 142/83 (BP Location: Right Arm)   Pulse 60   Temp 98.5 F (36.9 C) (Oral)   Resp 16   Ht 5\' 5"  (1.651 m)   Wt 99.8 kg   SpO2 100%   BMI 36.61 kg/m   Visual Acuity Right Eye Distance:   Left Eye Distance:   Bilateral Distance:    Right Eye Near:   Left Eye Near:    Bilateral Near:     Physical Exam  Constitutional: She appears well-developed and well-nourished. No distress.  HENT:  Head: Normocephalic.  Right Ear: External ear normal.  Left Ear: External ear normal.  Nose: Nose normal.  Mouth/Throat: Oropharynx is clear and moist.  Eyes: Pupils are equal, round, and reactive to light. Conjunctivae are normal.  Cardiovascular: Normal heart sounds.  Pulmonary/Chest: Breath sounds normal.  Abdominal: There is no tenderness.  Musculoskeletal: She exhibits tenderness.       Left lower leg: She exhibits tenderness.       Legs: Left lower leg below the knee mildly swollen with posterior calf tenderness to palpation.  No erythema or warmth. Pedal pulses intact.  Lymphadenopathy:    She has no cervical adenopathy.  Neurological: She is alert.  Skin: Skin is warm and dry.  Nursing note and vitals reviewed.    UC Treatments / Results  Labs (all labs ordered are listed, but only abnormal results are displayed) Labs Reviewed - No data to display  EKG None  Radiology No results found.  Procedures Procedures (including critical care time)  Medications Ordered in UC Medications - No data to display  Initial Impression / Assessment and Plan / UC Course  I have reviewed the triage vital signs and the nursing notes.  Pertinent labs & imaging results that were available during my care of the patient were reviewed by me and considered in my medical decision making (see chart for details).    Scheduled venous ultrasound left lower extremity at [facility] to rule out DVT.   Final  Clinical Impressions(s) / UC Diagnoses   Final diagnoses:  Pain of left calf   Discharge Instructions   None    ED Prescriptions    None         Kandra Nicolas, MD 09/21/17 1328

## 2017-09-21 NOTE — Discharge Instructions (Signed)
Please read and follow all provided instructions.  Your diagnoses today include:  1. Acute deep vein thrombosis (DVT) of tibial vein of left lower extremity (HCC)     Tests performed today include:  Blood counts and electrolytes - normal kidney function  An ultrasound of your leg that shows a blood clot  Vital signs. See below for your results today.   Medications prescribed:   Xarelto -blood thinner medication to prevent spread of blood clot  Take any prescribed medications only as directed.  Home care instructions:  Follow any educational materials contained in this packet.  Follow-up instructions: Please follow-up with your primary care provider in the next 1-2 week for further evaluation of your symptoms. This is very important for the management of your medications.   Return instructions:   Please return to the Emergency Department if you experience worsening symptoms.  Return immediately if you develop chest pain, shortness of breath, dizziness or fainting.  Return with new color change or weakness/numbness to your affected leg(s).  Return with bleeding, severe headache, confusion or altered mental status.  Please return if you have any other emergent concerns.  Additional Information:  Your vital signs today were: BP (!) 174/80 (BP Location: Left Arm)    Pulse 64    Temp 98.3 F (36.8 C) (Oral)    Resp 18    Ht 5\' 5"  (1.651 m)    Wt 99.8 kg    SpO2 100%    BMI 36.61 kg/m  If your blood pressure (BP) was elevated above 135/85 this visit, please have this repeated by your doctor within one month. --------------

## 2017-09-21 NOTE — ED Provider Notes (Signed)
[facility] Provider Note   CSN: [id number] Arrival date & time: 09/21/17  1325     History   Chief Complaint Chief Complaint  Patient presents with  . DVT    HPI Jennifer Shah is a 42 y.o. female.  Patient presents with left lower extremity DVT diagnosed today.  Patient had a uterine myomectomy several days ago due to bleeding from fibroids.  She became anemic from these fibroids and was placed on estrogen which she had taken up until the surgery.  After the surgery she developed left calf swelling and tenderness.  This has been persistent since the surgery and has been causing interference with walking.  She went to [facility] urgent care who sent her here for ultrasound study which was positive for clot in the tibial veins.  Patient denies chest pain or shortness of breath.  No previous history of blood clots.  Patient is not a smoker.  No family history of blood clots.  She currently has very minimal bleeding from her vaginal area.  She has been on ibuprofen for pain.      Past Medical History:  Diagnosis Date  . Anemia   . Dysmenorrhea   . Environmental allergies   . Fibroids    uterine   . Lipoma    left side posterior thigh     Patient Active Problem List   Diagnosis Date Noted  . Fibroid, uterine 09/16/2017  . Fibroids 09/04/2017  . Menorrhagia 09/04/2017  . Palpitations 12/05/2014  . Obesity 12/05/2014  . BREAST PAIN, BILATERAL 10/17/2009  . ELEVATED BLOOD PRESSURE WITHOUT DIAGNOSIS OF HYPERTENSION 10/17/2009    Past Surgical History:  Procedure Laterality Date  . MYOMECTOMY     09/16/17  . ROBOT ASSISTED MYOMECTOMY Bilateral 09/16/2017   Procedure: XI ROBOTIC ASSISTED MYOMECTOMY;  Surgeon: Delsa Bern, MD;  Location: [facility];  Service: Gynecology;  Laterality: Bilateral;  . WISDOM TOOTH EXTRACTION       OB History   None      Home Medications    Prior to Admission medications   Medication Sig Start Date End  Date Taking? Authorizing Provider  cetirizine (ZYRTEC) 10 MG tablet Take 10 mg by mouth daily as needed for allergies.    Yes [provider]  ibuprofen (ADVIL,MOTRIN) 600 MG tablet 1 po  pc   every 6 hours for 5 days then as needed for pain 09/16/17  Yes Florene Glen, [place], PA-C  metoprolol succinate (TOPROL-XL) 25 MG 24 hr tablet Take 25 mg by mouth daily.   Yes [provider]  ondansetron (ZOFRAN) 4 MG tablet Take 1 tablet (4 mg total) by mouth every 8 (eight) hours as needed for nausea or vomiting. 09/16/17  Yes Earnstine Regal, PA-C  oxyCODONE-acetaminophen (PERCOCET) 5-325 MG tablet Take 1 tablet by mouth every 6 (six) hours as needed for severe pain. 09/16/17 09/16/18 Yes Powell, [place], PA-C  ferrous sulfate 325 (65 FE) MG tablet Take 325 mg by mouth daily with breakfast.    [provider]  norethindrone (MICRONOR,CAMILA,ERRIN) 0.35 MG tablet Take 1 tablet by mouth daily.    [provider]    Family History Family History  Problem Relation Age of Onset  . Hypertension Mother   . Glaucoma Father     Social History Social History   Tobacco Use  . Smoking status: Never Smoker  . Smokeless tobacco: Never Used  Substance Use Topics  . Alcohol use: Yes    Comment: occ wine   .  Drug use: No     Allergies   Shellfish allergy   Review of Systems Review of Systems  Constitutional: Negative for fever.  HENT: Negative for rhinorrhea and sore throat.   Eyes: Negative for redness.  Respiratory: Negative for cough.   Cardiovascular: Positive for leg swelling. Negative for chest pain.  Gastrointestinal: Negative for abdominal pain, diarrhea, nausea and vomiting.  Genitourinary: Negative for dysuria.  Musculoskeletal: Positive for myalgias.  Skin: Negative for rash.  Neurological: Negative for headaches.     Physical Exam Updated Vital Signs BP (!) 174/80 (BP Location: Left Arm)   Pulse 64   Temp 98.3 F (36.8 C) (Oral)   Resp 18   Ht '5\' 5"'$   (1.651 m)   Wt 99.8 kg   SpO2 100%   BMI 36.61 kg/m   Physical Exam  Constitutional: She appears well-developed and well-nourished.  HENT:  Head: Normocephalic and atraumatic.  Eyes: Conjunctivae are normal.  Neck: Normal range of motion. Neck supple.  Cardiovascular: Normal rate.  No murmur heard. Pulmonary/Chest: No respiratory distress.  Abdominal: Soft. There is no tenderness.  Musculoskeletal: She exhibits edema and tenderness.  Patient with left calf swelling, mild tenderness.  No erythema or warmth.  Distal DP pulse intact.  Neurological: She is alert.  Skin: Skin is warm and dry.  Psychiatric: She has a normal mood and affect.  Nursing note and vitals reviewed.    ED Treatments / Results  Labs (all labs ordered are listed, but only abnormal results are displayed) Labs Reviewed  BASIC METABOLIC PANEL    EKG None  Radiology US Venous Img Lower Unilateral Left  Result Date: 09/21/2017 CLINICAL DATA:  Recent surgery with left calf pain and tenderness. EXAM: LEFT LOWER EXTREMITY VENOUS DOPPLER ULTRASOUND TECHNIQUE: Gray-scale sonography with graded compression, as well as color Doppler and duplex ultrasound were performed to evaluate the lower extremity deep venous systems from the level of the common femoral vein and including the common femoral, femoral, profunda femoral, popliteal and calf veins including the posterior tibial, peroneal and gastrocnemius veins when visible. The superficial great saphenous vein was also interrogated. Spectral Doppler was utilized to evaluate flow at rest and with distal augmentation maneuvers in the common femoral, femoral and popliteal veins. COMPARISON:  None. FINDINGS: Contralateral Common Femoral Vein: Respiratory phasicity is normal and symmetric with the symptomatic side. No evidence of thrombus. Normal compressibility. Common Femoral Vein: No evidence of thrombus. Normal compressibility, respiratory phasicity and response to  augmentation. Saphenofemoral Junction: No evidence of thrombus. Normal compressibility and flow on color Doppler imaging. Profunda Femoral Vein: No evidence of thrombus. Normal compressibility and flow on color Doppler imaging. Femoral Vein: No evidence of thrombus. Normal compressibility, respiratory phasicity and response to augmentation. Popliteal Vein: No evidence of thrombus. Normal compressibility, respiratory phasicity and response to augmentation. Calf Veins: Positive for thrombus. The posterior tibial veins are not compressible at the ankle. Posterior tibial veins are patent in the mid and distal calf. Peroneal veins appear to be patent. Other Findings:  None. IMPRESSION: Positive for deep venous thrombosis at the left ankle. Deep vein thrombosis involving a short segment of the left posterior tibial veins at the ankle. Electronically Signed   By: Markus Daft M.D.   On: 09/21/2017 13:10    Procedures Procedures (including critical care time)  Medications Ordered in ED Medications  Rivaroxaban (XARELTO) tablet 15 mg (has no administration in time range)     Initial Impression / Assessment and Plan / ED Course  I have reviewed the triage vital signs and the nursing notes.  Pertinent labs & imaging results that were available during my care of the patient were reviewed by me and considered in my medical decision making (see chart for details).     Patient seen and examined.  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  Will check kidney function.  After discussion with patient, we will start Xarelto which will be managed and followed by her PCP.  She will discontinue ibuprofen and NSAIDs and use Tylenol for pain.  Vital signs reviewed and are as follows: BP (!) 174/80 (BP Location: Left Arm)   Pulse 64   Temp 98.3 F (36.8 C) (Oral)   Resp 18   Ht '5\' 5"'$  (1.651 m)   Wt 99.8 kg   SpO2 100%   BMI 36.61 kg/m   3:16 PM normal kidney function.  Patient educated on Xarelto and given  prescription for starter kit.  Patient will follow-up with her doctor this week.  She will watch for signs of bleeding.  Questions answered.  She is comfortable discharged home at this time.  Final Clinical Impressions(s) / ED Diagnoses   Final diagnoses:  Acute deep vein thrombosis (DVT) of tibial vein of left lower extremity ([place])   Patient with provoked DVT in setting of recent surgery and recent estrogen use.  She has discontinued this.  No family history or genetic component known.  No signs or symptoms of PE today.  ED Discharge Orders         Ordered    Rivaroxaban 15 & 20 MG TBPK     09/21/17 1508           Carlisle Cater, PA-C 09/21/17 1516    Julianne Rice, MD 09/23/17 [phone]

## 2017-09-21 NOTE — ED Triage Notes (Signed)
Pt has confirmed DVT from ultrasound today. Left leg pain-recent surgery 09-16-17

## 2017-09-21 NOTE — ED Triage Notes (Signed)
Patient had pelvic surgery 09/16/17 and was up in stirrups; now calf of left lower leg is enlarged and she is worried about potential DVT; no pain, redness or warmth.

## 2017-10-28 ENCOUNTER — Emergency Department
Admission: EM | Admit: 2017-10-28 | Discharge: 2017-10-28 | Disposition: A | Discharge status: Home / Self Care | Payer: Managed Care, Other (non HMO) | Source: Home / Self Care | Attending: Family Medicine | Admitting: Family Medicine

## 2017-10-28 ENCOUNTER — Other Ambulatory Visit: Payer: Self-pay

## 2017-10-28 DIAGNOSIS — L0291 Cutaneous abscess, unspecified: Secondary | ICD-10-CM | POA: Diagnosis not present

## 2017-10-28 HISTORY — DX: Personal history of other venous thrombosis and embolism: Z86.718

## 2017-10-28 MED ORDER — CLINDAMYCIN HCL 300 MG PO CAPS: ORAL_CAPSULE | ORAL | 0 refills | Status: AC

## 2017-10-28 NOTE — Discharge Instructions (Addendum)
Change dressing daily until there is no further drainage.  Keep wound clean and dry.  Return for any signs of infection (or follow-up with family doctor):  Increasing redness, swelling, pain, heat, drainage, etc. Apply heat 2 or 3 times daily.

## 2017-10-28 NOTE — ED Provider Notes (Signed)
[facility]    CSN: [id number] Arrival date & time: 10/28/17  5638     History   Chief Complaint Chief Complaint  Patient presents with  . Abscess    HPI Jennifer Shah is a 42 y.o. female.   Patient reports that she noticed a "pimple" on her chin about 3 days ago.  The lesion rapidly became swollen and painful, and yesterday drained a significant amount of blood and pus.  The area remains painful.  She feels well otherwise. No fevers, chills, and sweats. She states that she has had several absesses in the past.  The history is provided by the patient.  Abscess  Abscess location: chin. Size:  3cm Abscess quality: draining, painful, redness and warmth   Red streaking: no   Duration:  2 days Progression:  Partially resolved Pain details:    Quality:  Dull and pressure   Severity:  Mild   Duration:  2 days   Timing:  Constant   Progression:  Improving Chronicity:  Recurrent Context: not diabetes, not insect bite/sting and not skin injury   Relieved by:  Nothing Exacerbated by: contact. Ineffective treatments:  Warm compresses Associated symptoms: no anorexia, no fatigue, no fever, no headaches and no nausea   Risk factors: prior abscess     Past Medical History:  Diagnosis Date  . Anemia   . Dysmenorrhea   . Environmental allergies   . Fibroids    uterine   . H/O blood clots    ankle in August 2019  . Lipoma    left side posterior thigh     Patient Active Problem List   Diagnosis Date Noted  . Fibroid, uterine 09/16/2017  . Fibroids 09/04/2017  . Menorrhagia 09/04/2017  . Palpitations 12/05/2014  . Obesity 12/05/2014  . BREAST PAIN, BILATERAL 10/17/2009  . ELEVATED BLOOD PRESSURE WITHOUT DIAGNOSIS OF HYPERTENSION 10/17/2009    Past Surgical History:  Procedure Laterality Date  . MYOMECTOMY     09/16/17  . ROBOT ASSISTED MYOMECTOMY Bilateral 09/16/2017   Procedure: XI ROBOTIC ASSISTED MYOMECTOMY;  Surgeon: Delsa Bern, MD;  Location:  [facility];  Service: Gynecology;  Laterality: Bilateral;  . WISDOM TOOTH EXTRACTION      OB History   None      Home Medications    Prior to Admission medications   Medication Sig Start Date End Date Taking? Authorizing Provider  rivaroxaban (XARELTO) 20 MG TABS tablet Take by mouth. 10/08/17  Yes [provider]  cetirizine (ZYRTEC) 10 MG tablet Take 10 mg by mouth daily as needed for allergies.     [provider]  clindamycin (CLEOCIN) 300 MG capsule Take one cap by mouth every 8 hours. 10/28/17   Kandra Nicolas, MD  metoprolol succinate (TOPROL-XL) 25 MG 24 hr tablet Take 25 mg by mouth daily.    [provider]  ondansetron (ZOFRAN) 4 MG tablet Take 1 tablet (4 mg total) by mouth every 8 (eight) hours as needed for nausea or vomiting. 09/16/17   Earnstine Regal, PA-C  oxyCODONE-acetaminophen (PERCOCET) 5-325 MG tablet Take 1 tablet by mouth every 6 (six) hours as needed for severe pain. 09/16/17 09/16/18  Earnstine Regal, PA-C  Rivaroxaban 15 & 20 MG TBPK Take as directed on package: Start with one 15mg  tablet by mouth twice a day with food. On Day 22, switch to one 20mg  tablet once a day with food. 09/21/17   Carlisle Cater, PA-C    Family History Family History  Problem  Relation Age of Onset  . Hypertension Mother   . Glaucoma Father     Social History Social History   Tobacco Use  . Smoking status: Never Smoker  . Smokeless tobacco: Never Used  Substance Use Topics  . Alcohol use: Yes    Comment: occ wine   . Drug use: No     Allergies   Shellfish allergy   Review of Systems Review of Systems  Constitutional: Negative for fatigue and fever.  Gastrointestinal: Negative for anorexia and nausea.  Neurological: Negative for headaches.  All other systems reviewed and are negative.    Physical Exam Triage Vital Signs ED Triage Vitals  Enc Vitals Group     BP 10/28/17 0903 (!) 141/88     Pulse Rate 10/28/17 0903 89     Resp --      Temp  10/28/17 0903 98.1 F (36.7 C)     Temp Source 10/28/17 0903 Oral     SpO2 10/28/17 0903 99 %     Weight 10/28/17 0904 222 lb (100.7 kg)     Height 10/28/17 0904 5\' 5"  (1.651 m)     Head Circumference --      Peak Flow --      Pain Score 10/28/17 0904 7     Pain Loc --      Pain Edu? --      Excl. in [place]? --    No data found.  Updated Vital Signs BP (!) 141/88 (BP Location: Right Arm)   Pulse 89   Temp 98.1 F (36.7 C) (Oral)   Ht 5\' 5"  (1.651 m)   Wt 100.7 kg   SpO2 99%   BMI 36.94 kg/m   Visual Acuity Right Eye Distance:   Left Eye Distance:   Bilateral Distance:    Right Eye Near:   Left Eye Near:    Bilateral Near:     Physical Exam  Constitutional: She appears well-developed and well-nourished. No distress.  HENT:  Head:    Right Ear: External ear normal.  Left Ear: External ear normal.  Nose: Nose normal.  Mouth/Throat: Oropharynx is clear and moist.  Chin erythematous, mildly swollen and tender to palpation with 26mm diameter central ulcer with small amount of purulent drainage.  No fluctuance. Tender submandibular nodes.  Eyes: Pupils are equal, round, and reactive to light. Conjunctivae are normal.  Neck: Normal range of motion.  Cardiovascular: Normal rate.  Pulmonary/Chest: Effort normal.  Neurological: She is alert.  Skin: Skin is warm and dry.  Nursing note and vitals reviewed.    UC Treatments / Results  Labs (all labs ordered are listed, but only abnormal results are displayed) Labs Reviewed - No data to display  EKG None  Radiology No results found.  Procedures Procedures (including critical care time)  Medications Ordered in UC Medications - No data to display  Initial Impression / Assessment and Plan / UC Course  I have reviewed the triage vital signs and the nursing notes.  Pertinent labs & imaging results that were available during my care of the patient were reviewed by me and considered in my medical decision making (see  chart for details).    Abscess has already drained; No I and D necessary.  Wound culture pending. Begin Clindamycin. Followup with Family Doctor if not improved in one week.    Final Clinical Impressions(s) / UC Diagnoses   Final diagnoses:  Abscess     Discharge Instructions     Change  dressing daily until there is no further drainage.  Keep wound clean and dry.  Return for any signs of infection (or follow-up with family doctor):  Increasing redness, swelling, pain, heat, drainage, etc. Apply heat 2 or 3 times daily.    ED Prescriptions    Medication Sig Dispense Auth. Provider   clindamycin (CLEOCIN) 300 MG capsule Take one cap by mouth every 8 hours. 30 capsule Kandra Nicolas, MD         Kandra Nicolas, MD 10/28/17 [phone]

## 2017-10-28 NOTE — ED Triage Notes (Signed)
Pt had a pimple on her chin yesterday.  Kept getting larger, and popped late yesterday. Drained blood and pus.  Now hard to the touch.

## 2017-10-31 ENCOUNTER — Telehealth: Payer: Self-pay | Admitting: *Deleted

## 2017-10-31 LAB — WOUND CULTURE
MICRO NUMBER: 91146173
SPECIMEN QUALITY: ADEQUATE

## 2017-10-31 NOTE — Telephone Encounter (Signed)
Call back: Patient reports the site on her chin is improving. WCX results given and discussed. Encouraged to complete antibiotic course.

## 2017-11-01 ENCOUNTER — Telehealth: Payer: Self-pay | Admitting: Emergency Medicine

## 2017-11-01 NOTE — Telephone Encounter (Signed)
Per Dr.Beese; inform patient wound culture MRSA and must switch to different antibiotic; will call to pharmacy on record; Doxycycline 100 mg po bid x 10 days.

## 2018-10-17 IMAGING — US US EXTREM LOW VENOUS*L*
1 series · 13 of 24 positions shown · non-contrast
Comparison: None.

CLINICAL DATA: Recent surgery with left calf pain and tenderness.



[Series 1: us extrem low venous*left* · 0.05mm/px · 13 of 35 slices shown]
[im 1/35]
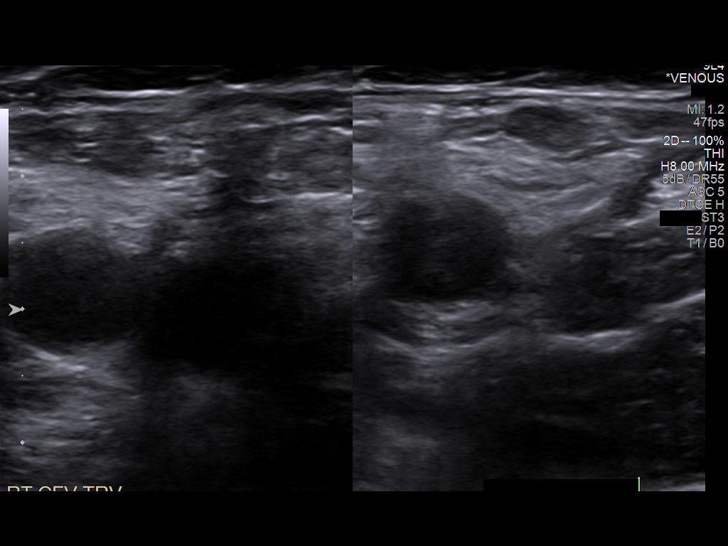
[im 3/35]
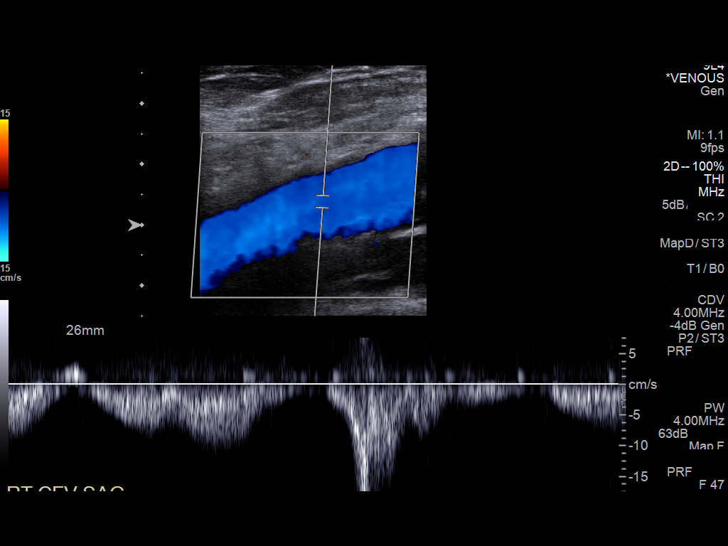
[im 6/35]
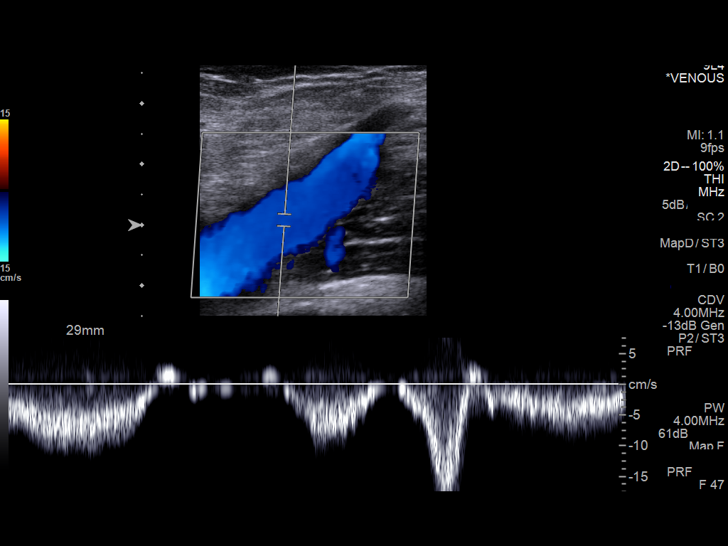
[im 9/35]
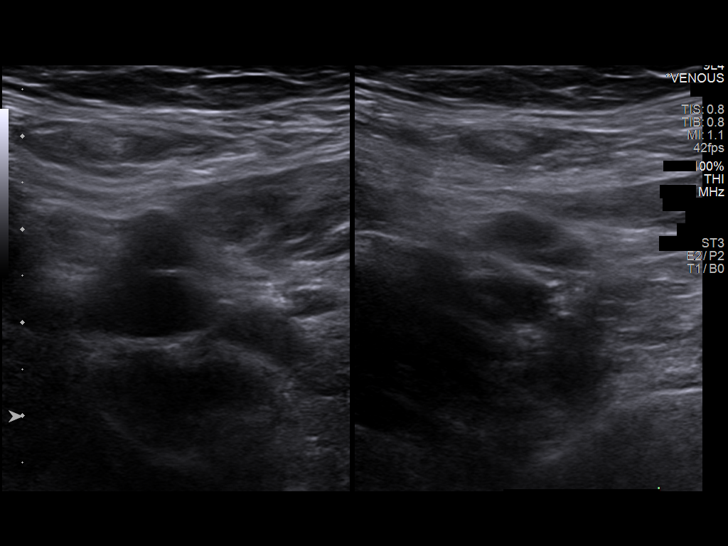
[im 12/35]
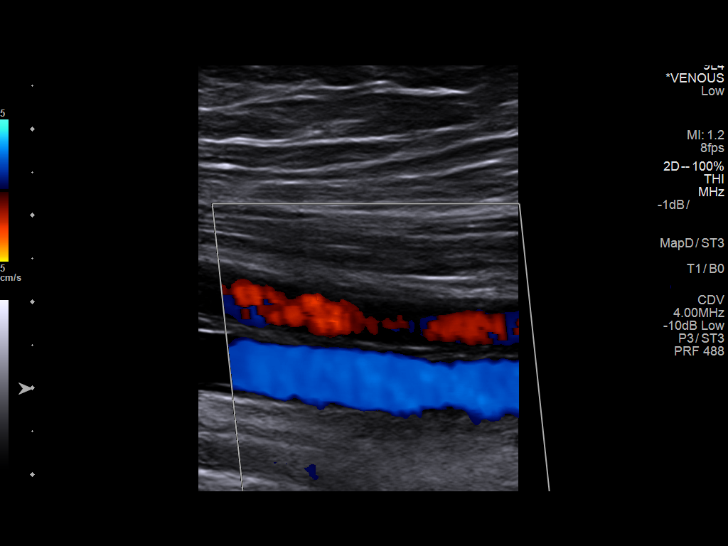
[im 15/35]
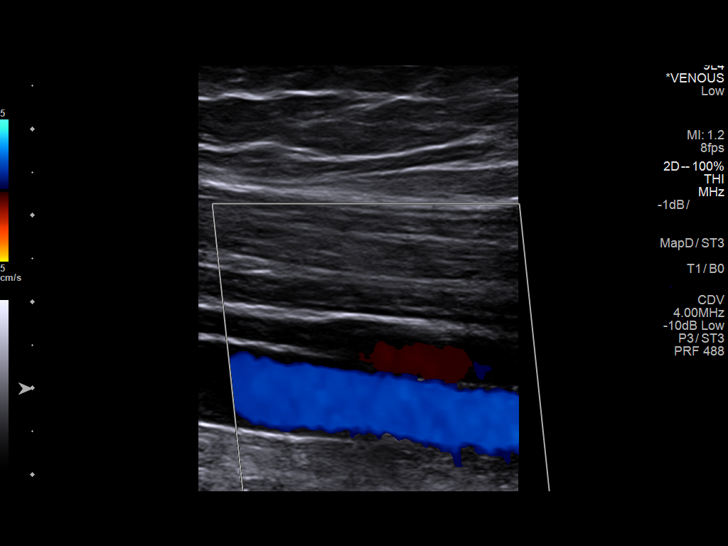
[im 18/35]
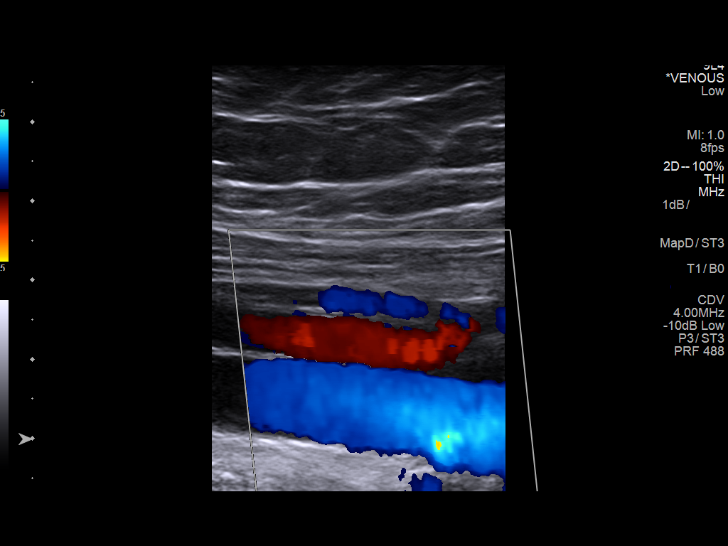
[im 20/35]
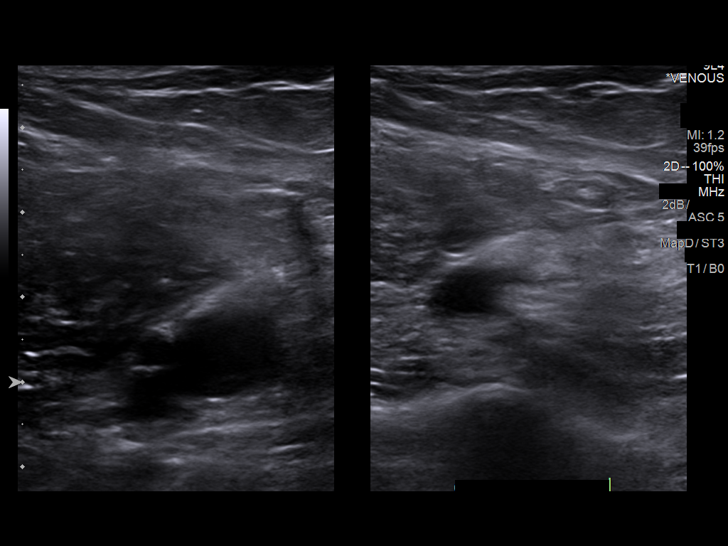
[im 23/35]
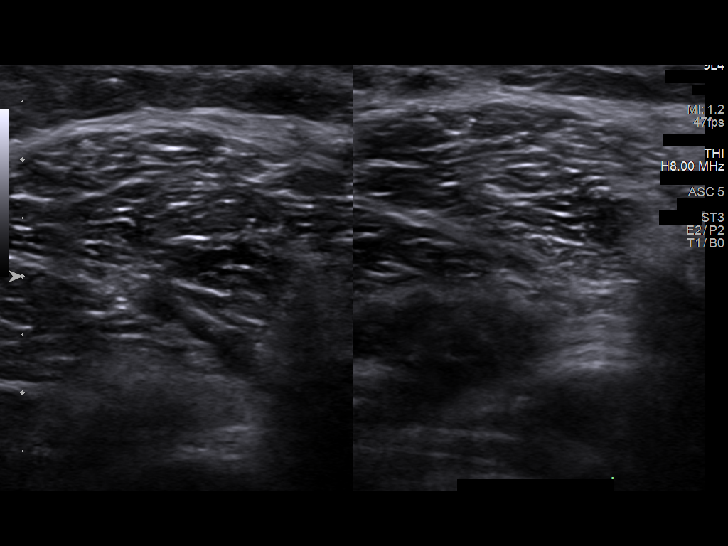
[im 26/35]
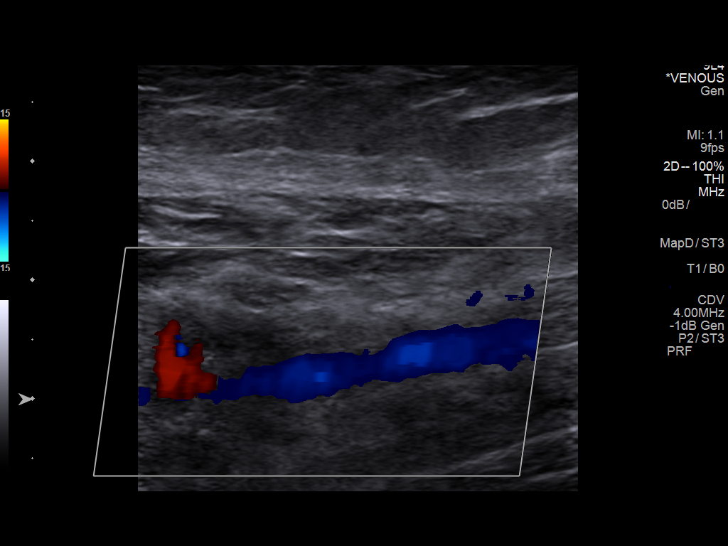
[im 29/35]
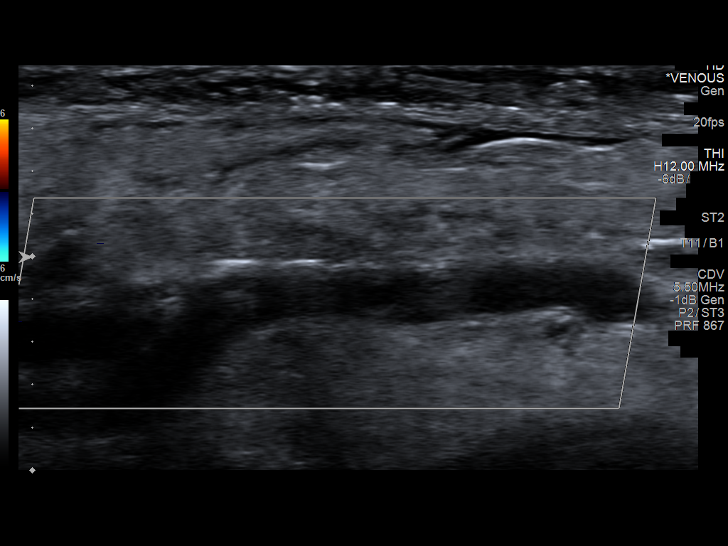
[im 32/35]
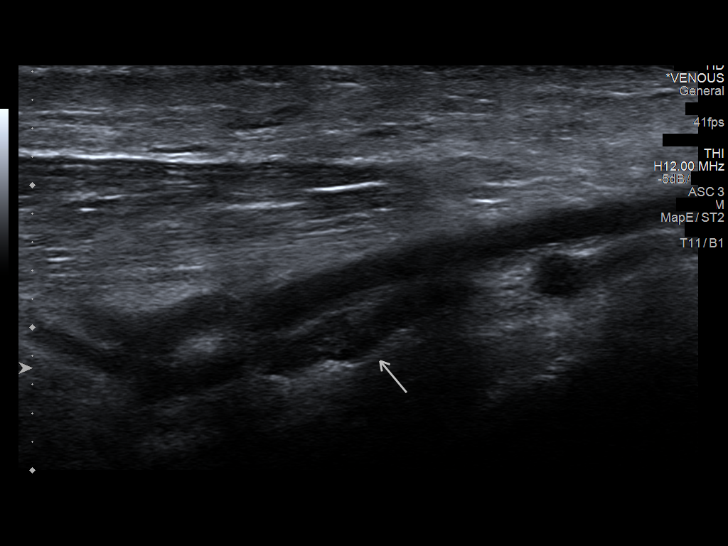
[im 35/35]
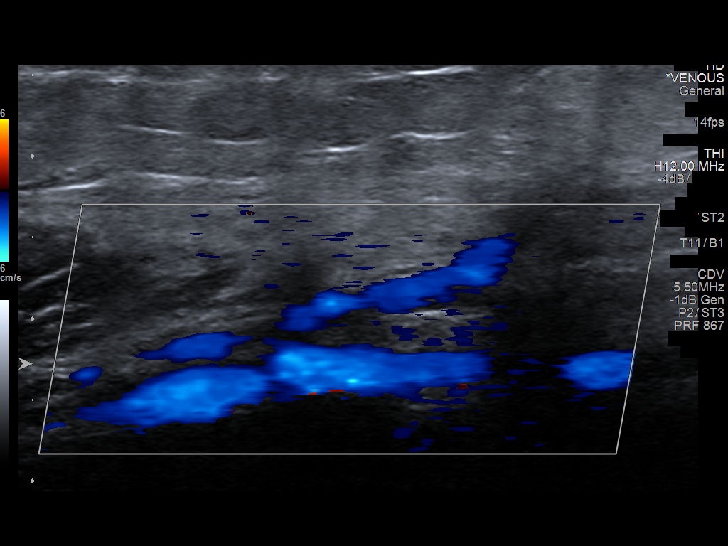

[13 of 24 positions shown; findings below may reference images not displayed]

FINDINGS: Contralateral Common Femoral Vein: Respiratory phasicity is normal
and symmetric with the symptomatic side. No evidence of thrombus.
Normal compressibility.

Common Femoral Vein: No evidence of thrombus. Normal
compressibility, respiratory phasicity and response to augmentation.

Saphenofemoral Junction: No evidence of thrombus. Normal
compressibility and flow on color Doppler imaging.

Profunda Femoral Vein: No evidence of thrombus. Normal
compressibility and flow on color Doppler imaging.

Femoral Vein: No evidence of thrombus. Normal compressibility,
respiratory phasicity and response to augmentation.

Popliteal Vein: No evidence of thrombus. Normal compressibility,
respiratory phasicity and response to augmentation.

Calf Veins: Positive for thrombus. The posterior tibial veins are
not compressible at the ankle. Posterior tibial veins are patent in
the mid and distal calf. Peroneal veins appear to be patent.

Other Findings:  None.
IMPRESSION: Positive for deep venous thrombosis at the left ankle. Deep vein
thrombosis involving a short segment of the left posterior tibial
veins at the ankle.

## 2019-01-13 ENCOUNTER — Other Ambulatory Visit: Payer: Self-pay | Admitting: Obstetrics and Gynecology
